# Patient Record
Sex: Female | Born: 1980 | Race: Black or African American | Hispanic: No | Marital: Single | Smoking: Current some day smoker
Health system: Southern US, Community
[De-identification: ages and names within clinical notes are randomized; demographics above are authoritative.]

## PROBLEM LIST (undated history)

## (undated) DIAGNOSIS — I10 Essential (primary) hypertension: Secondary | ICD-10-CM

---

## 1998-01-09 ENCOUNTER — Other Ambulatory Visit: Admission: RE | Admit: 1998-01-09 | Discharge: 1998-01-09 | Payer: Self-pay | Admitting: Obstetrics and Gynecology

## 1998-08-07 ENCOUNTER — Inpatient Hospital Stay (HOSPITAL_COMMUNITY): Admission: AD | Admit: 1998-08-07 | Discharge: 1998-08-07 | Payer: Self-pay | Admitting: Obstetrics and Gynecology

## 1998-08-15 ENCOUNTER — Inpatient Hospital Stay (HOSPITAL_COMMUNITY): Admission: AD | Admit: 1998-08-15 | Discharge: 1998-08-18 | Payer: Self-pay | Admitting: Obstetrics and Gynecology

## 1998-08-18 ENCOUNTER — Encounter (HOSPITAL_COMMUNITY): Admission: RE | Admit: 1998-08-18 | Discharge: 1998-11-16 | Payer: Self-pay | Admitting: *Deleted

## 1998-11-18 ENCOUNTER — Encounter (HOSPITAL_COMMUNITY): Admission: RE | Admit: 1998-11-18 | Discharge: 1999-02-16 | Payer: Self-pay | Admitting: *Deleted

## 1998-12-14 ENCOUNTER — Other Ambulatory Visit: Admission: RE | Admit: 1998-12-14 | Discharge: 1998-12-14 | Payer: Self-pay | Admitting: Obstetrics and Gynecology

## 1999-02-18 ENCOUNTER — Encounter (HOSPITAL_COMMUNITY): Admission: RE | Admit: 1999-02-18 | Discharge: 1999-05-19 | Payer: Self-pay | Admitting: Obstetrics and Gynecology

## 1999-05-22 ENCOUNTER — Encounter (HOSPITAL_COMMUNITY): Admission: RE | Admit: 1999-05-22 | Discharge: 1999-08-20 | Payer: Self-pay | Admitting: Obstetrics and Gynecology

## 1999-08-21 ENCOUNTER — Encounter (HOSPITAL_COMMUNITY): Admission: RE | Admit: 1999-08-21 | Discharge: 1999-11-19 | Payer: Self-pay | Admitting: Obstetrics and Gynecology

## 1999-12-20 ENCOUNTER — Encounter: Admission: RE | Admit: 1999-12-20 | Discharge: 2000-03-19 | Payer: Self-pay | Admitting: Obstetrics and Gynecology

## 2000-01-17 ENCOUNTER — Emergency Department (HOSPITAL_COMMUNITY): Admission: EM | Admit: 2000-01-17 | Discharge: 2000-01-17 | Payer: Self-pay | Admitting: Emergency Medicine

## 2000-01-18 ENCOUNTER — Encounter: Payer: Self-pay | Admitting: Emergency Medicine

## 2000-03-20 ENCOUNTER — Encounter: Admission: RE | Admit: 2000-03-20 | Discharge: 2000-06-18 | Payer: Self-pay | Admitting: Obstetrics and Gynecology

## 2000-06-05 ENCOUNTER — Other Ambulatory Visit: Admission: RE | Admit: 2000-06-05 | Discharge: 2000-06-05 | Payer: Self-pay | Admitting: *Deleted

## 2000-06-20 ENCOUNTER — Encounter: Admission: RE | Admit: 2000-06-20 | Discharge: 2000-09-18 | Payer: Self-pay | Admitting: Obstetrics and Gynecology

## 2001-01-01 ENCOUNTER — Other Ambulatory Visit: Admission: RE | Admit: 2001-01-01 | Discharge: 2001-01-01 | Payer: Self-pay | Admitting: *Deleted

## 2001-03-03 ENCOUNTER — Other Ambulatory Visit: Admission: RE | Admit: 2001-03-03 | Discharge: 2001-03-03 | Payer: Self-pay | Admitting: *Deleted

## 2001-10-27 ENCOUNTER — Other Ambulatory Visit: Admission: RE | Admit: 2001-10-27 | Discharge: 2001-10-27 | Payer: Self-pay | Admitting: Obstetrics and Gynecology

## 2002-06-13 ENCOUNTER — Emergency Department (HOSPITAL_COMMUNITY): Admission: EM | Admit: 2002-06-13 | Discharge: 2002-06-13 | Payer: Self-pay | Admitting: Emergency Medicine

## 2002-08-17 ENCOUNTER — Emergency Department (HOSPITAL_COMMUNITY): Admission: EM | Admit: 2002-08-17 | Discharge: 2002-08-18 | Payer: Self-pay | Admitting: Emergency Medicine

## 2002-11-09 ENCOUNTER — Observation Stay (HOSPITAL_COMMUNITY): Admission: AD | Admit: 2002-11-09 | Discharge: 2002-11-10 | Payer: Self-pay | Admitting: Obstetrics & Gynecology

## 2002-11-18 ENCOUNTER — Observation Stay (HOSPITAL_COMMUNITY): Admission: AD | Admit: 2002-11-18 | Discharge: 2002-11-19 | Payer: Self-pay | Admitting: Obstetrics & Gynecology

## 2002-11-22 ENCOUNTER — Ambulatory Visit (HOSPITAL_COMMUNITY): Admission: RE | Admit: 2002-11-22 | Discharge: 2002-11-22 | Payer: Self-pay | Admitting: Obstetrics and Gynecology

## 2002-11-22 ENCOUNTER — Encounter: Payer: Self-pay | Admitting: Obstetrics and Gynecology

## 2002-11-29 ENCOUNTER — Inpatient Hospital Stay (HOSPITAL_COMMUNITY): Admission: AD | Admit: 2002-11-29 | Discharge: 2002-11-30 | Payer: Self-pay | Admitting: Obstetrics & Gynecology

## 2002-11-30 ENCOUNTER — Encounter: Payer: Self-pay | Admitting: Obstetrics & Gynecology

## 2003-01-04 ENCOUNTER — Observation Stay (HOSPITAL_COMMUNITY): Admission: AD | Admit: 2003-01-04 | Discharge: 2003-01-05 | Payer: Self-pay | Admitting: Obstetrics & Gynecology

## 2003-01-27 ENCOUNTER — Inpatient Hospital Stay (HOSPITAL_COMMUNITY): Admission: AD | Admit: 2003-01-27 | Discharge: 2003-01-27 | Payer: Self-pay | Admitting: Obstetrics and Gynecology

## 2003-02-10 ENCOUNTER — Inpatient Hospital Stay (HOSPITAL_COMMUNITY): Admission: AD | Admit: 2003-02-10 | Discharge: 2003-02-10 | Payer: Self-pay | Admitting: Obstetrics & Gynecology

## 2003-02-18 ENCOUNTER — Inpatient Hospital Stay (HOSPITAL_COMMUNITY): Admission: AD | Admit: 2003-02-18 | Discharge: 2003-02-18 | Payer: Self-pay | Admitting: Obstetrics and Gynecology

## 2003-02-20 ENCOUNTER — Inpatient Hospital Stay (HOSPITAL_COMMUNITY): Admission: AD | Admit: 2003-02-20 | Discharge: 2003-02-20 | Payer: Self-pay | Admitting: Obstetrics and Gynecology

## 2003-03-10 ENCOUNTER — Inpatient Hospital Stay (HOSPITAL_COMMUNITY): Admission: AD | Admit: 2003-03-10 | Discharge: 2003-03-10 | Payer: Self-pay | Admitting: Obstetrics and Gynecology

## 2003-04-20 ENCOUNTER — Observation Stay (HOSPITAL_COMMUNITY): Admission: AD | Admit: 2003-04-20 | Discharge: 2003-04-20 | Payer: Self-pay | Admitting: Obstetrics and Gynecology

## 2003-04-24 ENCOUNTER — Inpatient Hospital Stay (HOSPITAL_COMMUNITY): Admission: AD | Admit: 2003-04-24 | Discharge: 2003-04-24 | Payer: Self-pay | Admitting: Obstetrics and Gynecology

## 2003-05-04 ENCOUNTER — Encounter (INDEPENDENT_AMBULATORY_CARE_PROVIDER_SITE_OTHER): Payer: Self-pay

## 2003-05-04 ENCOUNTER — Inpatient Hospital Stay (HOSPITAL_COMMUNITY): Admission: AD | Admit: 2003-05-04 | Discharge: 2003-05-07 | Payer: Self-pay | Admitting: Obstetrics and Gynecology

## 2003-05-08 ENCOUNTER — Encounter: Admission: RE | Admit: 2003-05-08 | Discharge: 2003-06-07 | Payer: Self-pay | Admitting: Obstetrics and Gynecology

## 2003-06-22 ENCOUNTER — Other Ambulatory Visit: Admission: RE | Admit: 2003-06-22 | Discharge: 2003-06-22 | Payer: Self-pay | Admitting: Obstetrics and Gynecology

## 2003-06-22 ENCOUNTER — Other Ambulatory Visit: Admission: RE | Admit: 2003-06-22 | Discharge: 2003-06-22 | Payer: Self-pay | Admitting: Internal Medicine

## 2003-07-08 ENCOUNTER — Encounter: Admission: RE | Admit: 2003-07-08 | Discharge: 2003-08-07 | Payer: Self-pay | Admitting: Obstetrics and Gynecology

## 2003-09-07 ENCOUNTER — Encounter: Admission: RE | Admit: 2003-09-07 | Discharge: 2003-10-07 | Payer: Self-pay | Admitting: Obstetrics and Gynecology

## 2003-10-08 ENCOUNTER — Encounter: Admission: RE | Admit: 2003-10-08 | Discharge: 2003-11-07 | Payer: Self-pay | Admitting: Obstetrics and Gynecology

## 2003-10-12 ENCOUNTER — Inpatient Hospital Stay (HOSPITAL_COMMUNITY): Admission: AD | Admit: 2003-10-12 | Discharge: 2003-10-15 | Payer: Self-pay | Admitting: Obstetrics and Gynecology

## 2003-10-23 ENCOUNTER — Inpatient Hospital Stay (HOSPITAL_COMMUNITY): Admission: AD | Admit: 2003-10-23 | Discharge: 2003-10-24 | Payer: Self-pay | Admitting: Obstetrics and Gynecology

## 2003-11-01 ENCOUNTER — Inpatient Hospital Stay (HOSPITAL_COMMUNITY): Admission: AD | Admit: 2003-11-01 | Discharge: 2003-11-02 | Payer: Self-pay | Admitting: Obstetrics and Gynecology

## 2003-12-06 ENCOUNTER — Encounter: Admission: RE | Admit: 2003-12-06 | Discharge: 2004-01-05 | Payer: Self-pay | Admitting: Obstetrics and Gynecology

## 2003-12-10 ENCOUNTER — Inpatient Hospital Stay (HOSPITAL_COMMUNITY): Admission: AD | Admit: 2003-12-10 | Discharge: 2003-12-10 | Payer: Self-pay | Admitting: Obstetrics and Gynecology

## 2003-12-13 ENCOUNTER — Emergency Department (HOSPITAL_COMMUNITY): Admission: EM | Admit: 2003-12-13 | Discharge: 2003-12-13 | Payer: Self-pay | Admitting: Emergency Medicine

## 2004-02-18 ENCOUNTER — Inpatient Hospital Stay (HOSPITAL_COMMUNITY): Admission: AD | Admit: 2004-02-18 | Discharge: 2004-02-21 | Payer: Self-pay | Admitting: Obstetrics and Gynecology

## 2004-02-27 ENCOUNTER — Ambulatory Visit (HOSPITAL_COMMUNITY): Admission: RE | Admit: 2004-02-27 | Discharge: 2004-02-27 | Payer: Self-pay | Admitting: Obstetrics and Gynecology

## 2004-03-19 ENCOUNTER — Encounter (INDEPENDENT_AMBULATORY_CARE_PROVIDER_SITE_OTHER): Payer: Self-pay | Admitting: Specialist

## 2004-03-19 ENCOUNTER — Inpatient Hospital Stay (HOSPITAL_COMMUNITY): Admission: AD | Admit: 2004-03-19 | Discharge: 2004-03-23 | Payer: Self-pay | Admitting: Obstetrics and Gynecology

## 2004-04-16 ENCOUNTER — Emergency Department (HOSPITAL_COMMUNITY): Admission: EM | Admit: 2004-04-16 | Discharge: 2004-04-16 | Payer: Self-pay | Admitting: Emergency Medicine

## 2004-04-18 ENCOUNTER — Emergency Department (HOSPITAL_COMMUNITY): Admission: EM | Admit: 2004-04-18 | Discharge: 2004-04-18 | Payer: Self-pay | Admitting: Emergency Medicine

## 2005-09-18 ENCOUNTER — Other Ambulatory Visit: Admission: RE | Admit: 2005-09-18 | Discharge: 2005-09-18 | Payer: Self-pay | Admitting: Obstetrics and Gynecology

## 2007-03-24 ENCOUNTER — Emergency Department (HOSPITAL_COMMUNITY): Admission: EM | Admit: 2007-03-24 | Discharge: 2007-03-24 | Payer: Self-pay | Admitting: Emergency Medicine

## 2010-07-09 ENCOUNTER — Emergency Department (HOSPITAL_COMMUNITY): Admission: EM | Admit: 2010-07-09 | Discharge: 2010-07-09 | Payer: Self-pay | Admitting: Emergency Medicine

## 2010-10-29 ENCOUNTER — Other Ambulatory Visit: Payer: Self-pay | Admitting: Obstetrics and Gynecology

## 2011-01-17 NOTE — Op Note (Signed)
NAME:  [name] [name], [name] [name]                          ACCOUNT NO.:  [id number]   MEDICAL RECORD NO.:  [id number]                   PATIENT TYPE:  INP   LOCATION:  9142                                 FACILITY:  [facility]   PHYSICIAN:  Miguel Aschoff, M.D.                    DATE OF BIRTH:  25-May-1981   DATE OF PROCEDURE:  03/19/2004  DATE OF DISCHARGE:                                 OPERATIVE REPORT   PREOPERATIVE DIAGNOSES:  Intrauterine twin gestation at 11 weeks in active  labor.   POSTOPERATIVE DIAGNOSES:  Intrauterine twin gestation at 34 weeks in active  labor with delivery of viable female infants.  Apgar 8 & 9 for baby A, Apgar  of 6 & 7 for baby B presenting as vertex breech presentation.   PROCEDURE:  Repeat low flap transverse cesarean section and bilateral  [place] tubal sterilization.   SURGEON:  Miguel Aschoff, M.D.   ANESTHESIA:  Spinal.   COMPLICATIONS:  None.   INDICATIONS FOR PROCEDURE:  The patient is a 30 year old black female,  gravida 4, para 2-0-1-2 at [redacted] weeks gestation.  The patient has an  intrauterine twin pregnancy previously treated for preterm labor with  Procardia. She presented to the [facility] on the date of admission  having regular uterine contractions, noted to be 5 cm dilated in active  labor. In view of the previous cesarean section and active labor status, at  this point she is being taken for immediate cesarean section.  In addition,  the patient has expressed a desire for sterilization procedure and signed  informed consent for tubal sterilization.   DESCRIPTION OF PROCEDURE:  The patient was taken to the operating room,  placed in a sitting position and spinal anesthesia was administered without  difficulty.  She was then placed in supine position, deviated to the left  and prepped and draped in the usual sterile fashion. A previous Pfannenstiel  incision was then reincised, the incision was extended down through the  subcutaneous tissue with  bleeding points being clamped and coagulated as  they were encountered. The fascia was then identified and incised  transversely.  It was then separated from the underlying rectus muscles.  The rectus muscles were then divided in the midline, peritoneum was then  found and entered careful avoiding underlying structures. The peritoneal  incision was extended, the bladder flap was then created and protected with  a bladder blade. At this point, an elliptical transverse incision was made  in the lower uterine segment, the amniotic cavity was entered, clear fluid  was obtained and at this point the patient was delivered of a viable female  infant presenting as vertex with an Apgar score of 8 at 1 minute and 9 at 5  minutes. The baby was delivered and handed to the pediatric team in  attendance. Once this was done, membranes were ruptured on baby B.  Baby B  was presenting as a double-footling breech. The baby was delivered without  difficulty from a left sacroanterior position.  The baby's Apgar score was 6  at 1 minute and 7 at 5 minutes.  At this point, cord bloods were obtained  for appropriate studies, the placenta was then delivered. At this point, the  uterine cavity was evacuated of any remaining products of conception.  Then  the __________ incision were identified, ligated using figure-of-eight  sutures of #1 Vicryl, then the uterus was closed in layers. The first layer  was running interlocking suture of #1 Vicryl, followed by an imbricating  suture of #1 Vicryl. The bladder flap was then reapproximated using running  continuous 2-0 Vicryl suture.  At this point, attention was directed to the  right fallopian tube, there was some peritubal filmy adhesions present.  These were lysed without difficulty and a knuckle of tube was created and  this knuckle of tube was created and this knuckle of tube was ligated with  two ligatures of #0 plain gut.  The area of tube above the ligatures  was  then excised and the tube was clamped and cauterized.  The identical  procedure was carried out on the left side again with some peritubal  adhesions noted.  After this was done, lap counts were taken and found to be  correct. The abdomen was irrigated with warm saline and then the abdomen was  closed. The parietoperitoneum was closed using running #0 Vicryl suture. The  rectus muscles were reapproximated using running continuous #0 Vicryl  suture.  The fascia was closed using two sutures of #0 Vicryl each starting  in the lateral fascial angles and meeting in the midline.  The patient had  both a keloid and a previous cesarean section scar.  This was then excised.  Interrupted #0 Vicryl suture was then placed in the subcutaneous tissue and  then skin incision was closed using staples. The estimated blood loss was  approximately 150 mL.  The patient tolerated the procedure well and went to  the recovery room in satisfactory condition.                                               Miguel Aschoff, M.D.    AR/MEDQ  D:  03/19/2004  T:  03/20/2004  Job:  [id number]

## 2011-01-17 NOTE — Discharge Summary (Signed)
NAME:  [name] [name], [name] [name]                          ACCOUNT NO.:  [id number]   MEDICAL RECORD NO.:  [id number]                   PATIENT TYPE:  INP   LOCATION:  9142                                 FACILITY:  [facility]   PHYSICIAN:  Gerrit Friends. Aldona Bar, M.D.                DATE OF BIRTH:  01/22/1981   DATE OF ADMISSION:  03/19/2004  DATE OF DISCHARGE:                                 DISCHARGE SUMMARY   DISCHARGE DIAGNOSES:  1. Thirty-three-week intrauterine pregnancy, twins, delivered by cesarean.     Twin A vertex, female, weight 4 pounds 4 ounces, Apgars 7 and 8.  Twin B     breech, weight 4 pounds 6.5 ounces, Apgars 6 and 7, female.  2. Preterm labor.  3. Desire for permanent elective sterilization.  4. Previous cesarean section.   SUMMARY:  This patient, a 30 year old female with twins, a gravida 4 para 2,  had problems with preterm labor during this pregnancy and was admitted  earlier with preterm labor and treated with magnesium sulfate and during  that admission received betamethasone.  She presented on March 19, 2004 in  active labor and was taken to the operating room by Dr. Tenny Craw at which time  she underwent a repeat low transverse cesarean section with delivery of twin  females under spinal anesthetic; at that time also had a tubal sterilization  procedure.  Other than chronic anemia and musculoskeletal discomfort, the  patient had a fairly benign postoperative course.  She was actually seen by  anesthesia on July 22 and essentially cleared for the potential of spinal  anesthetic complications.  The most recent hemoglobin was 7.7 on July 22  with a white count of 8900 and a platelet count at that time of 438,000.  She also had a comprehensive metabolic profile on July 22 and other than a  mildly elevated alkaline phosphatase of 148, the comprehensive metabolic  profile was fairly normal with the exception of a low total protein and low  albumin.   On the morning of July 23 she was  feeling better, ambulating well,  tolerating a regular diet fairly well, was comfortable with her analgesia.  The babies were in the intensive care unit and both were doing well,  destined for discharge probably the next 5-7 days.  She was ready for  discharge on the morning of July 23.  Her staples were removed and her wound  Steri-Stripped.  Her wound was clean and dry at the time of discharge.  She  was given all appropriate instructions at the time of discharge and  understood all instructions well.  Discharge medications include Dilaudid 2  mg one q.3-4h. as needed for pain #60 no refills, Motrin 600 mg q.6h. as  needed for less severe pain, and she requested Lexapro for postpartum  depression (previous problem with previous pregnancies as well) and was  given Lexapro 10 mg to take  every day.  She will return to the office for  follow-up in approximately 4 weeks' time or as needed.   CONDITION ON DISCHARGE:  Improved.                                               Gerrit Friends. Aldona Bar, M.D.    RMW/MEDQ  D:  03/23/2004  T:  03/23/2004  Job:  [id number]

## 2011-01-17 NOTE — Discharge Summary (Signed)
NAME:  [name], [name]                          ACCOUNT NO.:  [id number]   MEDICAL RECORD NO.:  [id number]                   PATIENT TYPE:  INP   LOCATION:  9158                                 FACILITY:  [facility]   PHYSICIAN:  Carrington Clamp, M.D.              DATE OF BIRTH:  1981-03-13   DATE OF ADMISSION:  02/18/2004  DATE OF DISCHARGE:  02/21/2004                                 DISCHARGE SUMMARY   ADMISSION DIAGNOSIS:  Preterm labor at 29 weeks with twins.   DISCHARGE DIAGNOSIS:  Preterm labor at 29 weeks with twins.   PERTINENT PROCEDURES PERFORMED:  1. Magnesium sulfate infusion.  2. Ultrasound, which revealed twin gestation, concordant growth, and normal     amniotic fluid.  Growth was consistent with 29 weeks.  3. Betamethasone administration on June 19 and June 20.   Pertinent laboratory tests were liver function tests, which were mildly  elevated, but on June 20 were AST 25, ALT 37, and total bilirubin 3.7.  Hemoglobin and hematocrit 8.1 and 25.3.   HISTORY AND PHYSICAL:  Please refer to the written history and physical on  chart.  Briefly, this is a 30 year old G4, P1-1-1-2, at 29-4/7 weeks,  complaining of pain and headache as well as some contractions.  The patient  was checked and found to be 1, 50, and -2.  Blood pressure was stable at  that point.  FHTs were reactive for A and B and tocometer was not recorded.   Assessment at that point was:  1. Twin pregnancy at 29+ weeks, preterm labor.  The patient was started on     magnesium sulfate and administered betamethasone.  2. Liver function tests were mildly elevated and repeated later.  3. Slight hypokalemia on admission was also corrected.  4. Anemia as well.   By hospital day #2, the patient was stable with a potassium of 3.7.  Liver  function tests within normal, good short-term and long-term reactivity for  both fetuses, and 2+ reflexes.  Magnesium was turned off that afternoon but  later on that evening, she  began contracting again and was restarted.  On  hospital day #3, the patient had done well overnight.  Cervical exam at that  point was fingertip, 1.5, and high.  The patient was treated for a yeast  infection.  The patient had no contractions over night, and therefore  magnesium was stopped again and Procardia 10 mg q.i.d. was started.  Hemoglobin drifted down to 8.1 but because of the patient's history of  having had an abruption at the time of her IV iron administration the last  pregnancy, I would prefer to keep her on p.o. iron until it is necessary to  do otherwise.  The patient has been tolerating a regular diet despite some  severe problems with hyperemesis in the past and will restart her liquid  iron daily and recheck her hemoglobin in two weeks  to make sure it is  stable.  By hospital day #4 the patient had tolerated being off the  magnesium overnight and on the Procardia without complication.  Fetal heart  tones were reactive for both A and B, and there was occasional contraction  noted.  Cervical exam was deferred, and the patient was discharged with the  following:   Medications are Procardia 10 mg one p.o. q.6h. continuously until return.   Diet was regular.  Activity was shower, bath, and eat only.  The patient was  counseled not to have any sexual activity.  Follow-up was with [facility] with Dr. Henderson Cloud on Monday.                                               Carrington Clamp, M.D.    MH/MEDQ  D:  02/21/2004  T:  02/23/2004  Job:  [id number]

## 2011-01-17 NOTE — Discharge Summary (Signed)
NAME:  [name] [name], SCHIMPF                          ACCOUNT NO.:  [id number]   MEDICAL RECORD NO.:  [id number]                   PATIENT TYPE:  INP   LOCATION:  9158                                 FACILITY:  [facility]   PHYSICIAN:  Carrington Clamp, M.D.              DATE OF BIRTH:  [birth year]   DATE OF ADMISSION:  02/18/2004  DATE OF DISCHARGE:  02/21/2004                                 DISCHARGE SUMMARY   FINAL DIAGNOSES:  1. Twin gestation at 29+ weeks.  2. Preterm labor.  3. Anemia.   HOSPITAL COURSE:  This 30 year old G4 P3 presents at 78 and four-sevenths  weeks gestation to maternity admissions with a headache and contractions.  The patient's antepartum course had been complicated by a history of anemia  this pregnancy which she was on iron for, and hyperemesis, a history of a  previous cesarean section, and a twin gestation.  The patient was started on  magnesium sulfate for the contractions and Stadol for her headache, was  given IV fluids for some probable dehydration.  The headaches improved.  The  patient's blood pressures were within normal limits.  She did not have any  visual changes.  Her lab work was all within normal limits except for a  hemoglobin of 8.9 and some slight hypocalcemia.  The patient's cervix was  about 1 cm dilated, 50% effaced, and -2 station which was no change from the  initial maternity admissions cervical exam.  At this point the patient was  admitted for continued monitoring and magnesium sulfate.  The patient had an  ultrasound that showed good growth of twin A and B with normal fluid volume  and both babies in the vertex position.  The patient was having some  occasional vomiting and cramping but less than upon admission.  The patient  was felt stable on hospital day #2, was weaned off of her magnesium sulfate  and started on oral terbutaline.  By hospital day #3 contractions began  again despite the terbutaline and magnesium sulfate was restarted.   The  babies were reactive during this point with good fetal heart tones.  The  magnesium sulfate was able to be stopped again on hospital day #3.  She had  received her two doses of betamethasone.  She was started on oral Procardia.  She was given liquid iron daily, continued to be monitored with hemoglobins  and hematocrits.  The patient also had a yeast infection which was treated  with antifungal cream.  By hospital day #4 the patient was doing much  better, only having occasional contractions, and she was felt ready for  discharge.  She was stable on her Procardia.  She was stable on her  Procardia 10 mg q.6h., was told to decrease activities, continue her  bedrest, of course was to call with any increase in her contractions or  problems.  She was to follow up in  our office on Monday with Dr. Henderson Cloud.   LABORATORY DATA AT DISCHARGE:  The patient had a hemoglobin of 8.1; white  blood cell count of 10.0; platelets of 336,000.  Calcium of 6.4.  Normal  liver function tests.     Leilani Able, P.A.-C.                Carrington Clamp, M.D.    MB/MEDQ  D:  02/26/2004  T:  02/26/2004  Job:  [id number]

## 2011-01-17 NOTE — Discharge Summary (Signed)
   NAME:  [name] [name], [name] [name] NO.:  [id number]   MEDICAL RECORD NO.:  [id number]                   PATIENT TYPE:   LOCATION:                                       FACILITY:   PHYSICIAN:  Miguel Aschoff, M.D.                    DATE OF BIRTH:   DATE OF ADMISSION:  11/29/2002  DATE OF DISCHARGE:  11/30/2002                                 DISCHARGE SUMMARY   ADMISSION DIAGNOSES:  1. Intrauterine pregnancy.  2. Hyperemesis gravidarum.   FINAL DIAGNOSES:  1. Intrauterine pregnancy.  2. Hyperemesis gravidarum.   BRIEF HISTORY:  The patient is a 30 year old black female in early pregnancy  who has been admitted for persistent nausea and vomiting.  Patient has lost  24 pounds since becoming pregnant due to the persistent nausea and vomiting.  She was admitted for IV hydration and assessment of the etiology of her  hyperemesis.   LABORATORY DATA:  On admission, her hemoglobin was 11.1, white count 8700.  Initial laboratory studies showed a sodium of 132, potassium of 3.0,  chloride of 93.  There were minor elevations of her AST at 54 and her ALT at  67.  Amylase was also elevated at 160.  Ultrasound was carried out and  revealed a 13-week intrauterine gestation with positive fetal heart  activity.  Gallbladder ultrasound was carried out; there was no evidence of  biliary dilatation.  No evidence of gallstones or gallbladder thickening was  noted.  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   HOSPITAL COURSE:  Patient continued to receive intravenous fluid for  correction of the nausea and vomiting and electrolyte abnormalities.  The  patient also had a surgical consultation to further assess the patient's  gallbladder; this was done by Dr. Consuello Bossier.  Dr. Zachery Dakins did not  feel at the current time that the patient needed gallbladder surgery.  By  November 30, 2002, she was stable.  Consultation was made to have a Zofran pump  administered to  the patient, and this was accomplished on November 30, 2002,  and the patient was discharged home.   DISCHARGE INSTRUCTIONS:  She was instructed to return to the office in one  week, to call for any problems with persistent nausea and vomiting.                                               Miguel Aschoff, M.D.    AR/MEDQ  D:  03/01/2003  T:  03/01/2003  Job:  [id number]

## 2011-01-17 NOTE — Discharge Summary (Signed)
NAME:  [name] [name], [name]                          ACCOUNT NO.:  [id number]   MEDICAL RECORD NO.:  [id number]                   PATIENT TYPE:  INP   LOCATION:  9319                                 FACILITY:  [facility]   PHYSICIAN:  Gerrit Friends. Aldona Bar, M.D.                DATE OF BIRTH:  Jan 27, 1981   DATE OF ADMISSION:  10/12/2003  DATE OF DISCHARGE:  10/15/2003                                 DISCHARGE SUMMARY   DISCHARGE DIAGNOSES:  1. Twin pregnancy, 11 to 12-week gestation.  2. Severe nausea and vomiting.  3. Elevated liver function tests and amylase secondary to #2.   SUMMARY:  A 30 year old gravida 4, para 2, with known twins, was admitted  from the office on February 10 with severe nausea and vomiting.  She was  begun on intravenous fluids as well as Zofran and Phenergan.  Over the  course of the subsequent three days, improved.  However, her liver function  tests were noted to be slightly abnormal.  Her SGOT on February 10 was 41,  on the day of discharge 32.  Her SGPT was 77 on the day of admission and 67  on the day of discharge.  In addition, an amylase was checked on February 11  and was found to be 159; on February 12, it was 167;  on February 13, the  day of discharge, it was 149.   The patient symptomatically improved dramatically over the 24 hours prior to  discharge, and when she was discharged, she was tolerating oral food well  and having minimal nausea and vomiting.  Her discharge hemoglobin was noted  to be 10.1 with a white count of 7700 and a platelet count of 337,000.  She  did have an ultrasound while she was in the hospital (this was done on  February 10), and it confirmed twins measuring consistent with 11 weeks and  three days.   On the afternoon of February 13, she was feeling dramatically better and was  discharged to home.   DISCHARGE MEDICATIONS:  1. Zofran 8 mg tablets 1 every 6 to 8 hours as needed for nausea and     vomiting.  2. Phenergan 25 mg tablet 1  every 4 to 6 hours as needed for nausea and     vomiting.  3. Unisom for vitamin B6.  4. Benadryl 25 mg at bedtime.   FOLLOW UP:  She will return to the office in four days' time, and at that  time we will have her liver function tests and amylase repeated.  If she  becomes symptomatically worse prior to Thursday, will return to [facility] for readmission.   CONDITION ON DISCHARGE:  Improved.  Gerrit Friends. Aldona Bar, M.D.    RMW/MEDQ  D:  10/15/2003  T:  10/15/2003  Job:  [id number]

## 2011-01-17 NOTE — Op Note (Signed)
NAME:  [name] [name], [name] [name]                          ACCOUNT NO.:  [id number]   MEDICAL RECORD NO.:  [id number]                   PATIENT TYPE:  INP   LOCATION:  9128                                 FACILITY:  [facility]   PHYSICIAN:  Carrington Clamp, M.D.              DATE OF BIRTH:  1980/12/22   DATE OF PROCEDURE:  05/04/2003  DATE OF DISCHARGE:                                 OPERATIVE REPORT   PREOPERATIVE DIAGNOSES:  1. Bradycardia at 35 weeks to 50s.  2. Suspected abruption.   POSTOPERATIVE DIAGNOSES:  1. Bradycardia at 35 weeks to 50s.  2. Suspected abruption.   PROCEDURE:  Primary low transverse cesarean section.   ATTENDING:  Carrington Clamp, M.D.   ASSISTANT:  Randye Lobo, M.D.   ANESTHESIA:  General endotracheal anesthesia.   ESTIMATED BLOOD LOSS:  500 mL.   FLUIDS REPLACED:  1000 mL.   URINE OUTPUT:  50 mL.   COMPLICATIONS:  None.   FINDINGS:  A female infant, vertex presentation, with Apgars 7 and 9.  Cord  pH was 7.1.  There were otherwise normal tubes, ovaries, and uterus seen.   MEDICATIONS:  Ancef and Pitocin.   PATHOLOGY:  Placenta.   COUNTS:  Correct x3.   REASON FOR OPERATION:  Ms. Mi Balla had been admitted to the MAU,  receiving her test dose of IV iron dextran secondary to anemia because of  her severe hyperemesis during her pregnancy.  The MAU nurse states that just  after she had received her test dose, the patient complained of severe chest  pain, back pain, and some swelling, and the fetal heart rate went down to  the 50s for several minutes.  The nurse stated that the uterus got very,  very hard and the fetal heart tones would not recover despite resuscitative  efforts.  I had been in another delivery but responded as soon as I could,  and the fetal heart tones were still in the 50s for several minutes.  I  discussed with the patient the need for immediate delivery by cesarean  section secondary to suspected abruption.   The patient  was rushed to the OR.  Fetal heart tones were rechecked again,  and they were back up to 115.  The patient was prepped and draped in the  usual fashion but then was intubated and a stat C-section then performed.   In addition to this, the patient had been tested for routine gonorrhea and  Chlamydia testing per state law at 35+ weeks.  Her Chlamydia had come back  positive and she was to be treated today while she was in the hospital.  Therefore, at the time of delivery she had not been treated for this and  pediatrics was alerted to the positive Chlamydia status.   TECHNIQUE:  The patient was prepped and draped in the usual sterile fashion  and general anesthesia was then performed.  A Pfannenstiel skin incision was  made with the scalpel and carried down to the fascia with the Bovie cautery.  The fascia was incised in the midline with the scalpel and then carried in a  transverse curvilinear manner with the Mayo scissors.  The fascia was  reflected superiorly and inferiorly from the rectus muscles and the rectus  muscles split in the midline.  A bowel-free portion of the peritoneum was  entered into bluntly and the peritoneum stretched to accommodate the  instruments.   The bladder blade was then placed and the vesicouterine fascia tented up and  incised in a transverse curvilinear manner.  Blunt dissection was then used  to bring down the bladder flap and the bladder blade was replaced.  A 2 cm  transverse incision was made in the upper portion of the lower uterine  segment until clear fluid was noted upon entry of the amnion.  The incision  was extended in a transverse curvilinear manner and the baby was identified  in a vertex presentation and delivered without complication.  The baby was  vigorous shortly after birth and did not come out floppy.  The baby was  handed immediately to awaiting pediatrics, who attended the baby.   Cord bloods and cord gases were obtained and the  placenta removed manually.  There was no clear indication of abruption; however, the placenta was then  sent to pathology for examination.  The uterus was then exteriorized,  wrapped in a wet lap, and cleared of all debris.  The uterine incision was  then closed with a running locked stitch of 0 Monocryl.  Two additional  figure-of-eight stitches were used to assure hemostasis.  The uterus was  then reapproximated in the abdomen and the abdomen cleared of all debris  with irrigation.  The uterine incision was reinspected and found to be  hemostatic.  The peritoneum was then closed with a running stitch of 2-0  Vicryl.  The fascia was closed with a running stitch of 0 Vicryl.  The  subcutaneous tissue was rendered hemostatic with Bovie cautery and  irrigation.  The skin was closed with staples.  The patient tolerated the  procedure well, was returned to the recovery room in stable condition.                                               Carrington Clamp, M.D.    MH/MEDQ  D:  05/04/2003  T:  05/04/2003  Job:  [id number]

## 2011-01-17 NOTE — Discharge Summary (Signed)
   NAME:  [name] [name], [name] [name]                          ACCOUNT NO.:  [id number]   MEDICAL RECORD NO.:  [id number]                   PATIENT TYPE:  INP   LOCATION:  9128                                 FACILITY:  [facility]   PHYSICIAN:  Malva Limes, M.D.                 DATE OF BIRTH:  04-18-1981   DATE OF ADMISSION:  05/04/2003  DATE OF DISCHARGE:  05/07/2003                                 DISCHARGE SUMMARY   PRINCIPAL DISCHARGE DIAGNOSES:  Fetal bradycardia.   PRINCIPAL PROCEDURE:  Primary low transverse cesarean section.   HISTORY OF PRESENT ILLNESS:  [name] is a 30 year old white female G2, P1-  0-0-1, last menstrual period August 27, 2002, [facility] June 03, 2003 who  presented to [facility] on May 04, 2003 for her second dose of IV  iron therapy secondary to severe anemia.  The patient had significant  hyperemesis and was unable to take any iron by mouth.  The patient had  undergone an IV transfusion approximately two weeks prior to this without  any complications.  The patient was undergoing test dose in maternity  admissions when she complained of sudden severe back pain and the fetal  heart tones went down into the 50s for several minutes.  The patient was  seen by Carrington Clamp, M.D. and it was felt that she possibly had a  placental abruption.  Therefore, she was taken to the operating room for an  emergent cesarean section.  This was performed by Carrington Clamp, M.D. and  Randye Lobo, M.D.  The patient delivered one live viable female infant.  Apgars were 7 at one minute and 9 at five minutes.  The cord pH was 7.1.  The patient had no evidence of an abruption.  The placenta was sent to  pathology and returned normal.  For a complete description of the operative  procedure, please see the dictated operative note.  The patient's  postoperative care was uneventful.  Her postoperative hemoglobin was 7.9.  The patient was ambulating without difficulty and eating a  regular diet.  She was discharged to home on postoperative day #3.  The incision appeared  to be healing well.  The patient was instructed to follow up in the office  in four weeks.  The patient was sent home with Tylox.                                               Malva Limes, M.D.    MA/MEDQ  D:  06/02/2003  T:  06/02/2003  Job:  [id number]

## 2013-12-15 ENCOUNTER — Other Ambulatory Visit: Payer: Self-pay | Admitting: Obstetrics and Gynecology

## 2014-03-08 ENCOUNTER — Emergency Department (HOSPITAL_COMMUNITY)
Admission: EM | Admit: 2014-03-08 | Discharge: 2014-03-09 | Disposition: A | Discharge status: Home / Self Care | Payer: BC Managed Care – PPO | Attending: Emergency Medicine | Admitting: Emergency Medicine

## 2014-03-08 ENCOUNTER — Encounter (HOSPITAL_COMMUNITY): Payer: Self-pay | Admitting: Emergency Medicine

## 2014-03-08 ENCOUNTER — Emergency Department (HOSPITAL_COMMUNITY): Payer: BC Managed Care – PPO

## 2014-03-08 DIAGNOSIS — F17200 Nicotine dependence, unspecified, uncomplicated: Secondary | ICD-10-CM | POA: Insufficient documentation

## 2014-03-08 DIAGNOSIS — Y9289 Other specified places as the place of occurrence of the external cause: Secondary | ICD-10-CM | POA: Insufficient documentation

## 2014-03-08 DIAGNOSIS — I10 Essential (primary) hypertension: Secondary | ICD-10-CM | POA: Insufficient documentation

## 2014-03-08 DIAGNOSIS — Y9389 Activity, other specified: Secondary | ICD-10-CM | POA: Insufficient documentation

## 2014-03-08 DIAGNOSIS — R079 Chest pain, unspecified: Secondary | ICD-10-CM | POA: Insufficient documentation

## 2014-03-08 DIAGNOSIS — S01501A Unspecified open wound of lip, initial encounter: Secondary | ICD-10-CM | POA: Insufficient documentation

## 2014-03-08 DIAGNOSIS — X58XXXA Exposure to other specified factors, initial encounter: Secondary | ICD-10-CM | POA: Insufficient documentation

## 2014-03-08 DIAGNOSIS — I951 Orthostatic hypotension: Secondary | ICD-10-CM

## 2014-03-08 DIAGNOSIS — R51 Headache: Secondary | ICD-10-CM | POA: Insufficient documentation

## 2014-03-08 HISTORY — DX: Essential (primary) hypertension: I10

## 2014-03-08 LAB — BASIC METABOLIC PANEL
ANION GAP: 14 (ref 5–15)
BUN: 20 mg/dL (ref 6–23)
CHLORIDE: 98 meq/L (ref 96–112)
CO2: 23 meq/L (ref 19–32)
CREATININE: 1.04 mg/dL (ref 0.50–1.10)
Calcium: 9.6 mg/dL (ref 8.4–10.5)
GFR calc Af Amer: 80 mL/min — ABNORMAL LOW (ref >90)
GFR, EST NON AFRICAN AMERICAN: 69 mL/min — AB (ref >90)
Glucose, Bld: 96 mg/dL (ref 70–99)
POTASSIUM: 4.1 meq/L (ref 3.7–5.3)
SODIUM: 135 meq/L — AB (ref 137–147)

## 2014-03-08 LAB — I-STAT TROPONIN, ED: TROPONIN I, POC: 0 ng/mL (ref 0.00–0.08)

## 2014-03-08 LAB — CBC WITH DIFFERENTIAL/PLATELET
BASOS ABS: 0 10*3/uL (ref 0.0–0.1)
BASOS PCT: 0 % (ref 0–1)
Eosinophils Absolute: 0.3 10*3/uL (ref 0.0–0.7)
Eosinophils Relative: 2 % (ref 0–5)
HCT: 32.6 % — ABNORMAL LOW (ref 36.0–46.0)
HEMOGLOBIN: 9.8 g/dL — AB (ref 12.0–15.0)
Lymphocytes Relative: 23 % (ref 12–46)
Lymphs Abs: 2.9 10*3/uL (ref 0.7–4.0)
MCH: 17.5 pg — ABNORMAL LOW (ref 26.0–34.0)
MCHC: 30.1 g/dL (ref 30.0–36.0)
MCV: 58.2 fL — AB (ref 78.0–100.0)
MONOS PCT: 7 % (ref 3–12)
Monocytes Absolute: 0.9 10*3/uL (ref 0.1–1.0)
Neutro Abs: 8.7 10*3/uL — ABNORMAL HIGH (ref 1.7–7.7)
Neutrophils Relative %: 68 % (ref 43–77)
PLATELETS: 430 10*3/uL — AB (ref 150–400)
RBC: 5.6 MIL/uL — ABNORMAL HIGH (ref 3.87–5.11)
RDW: 20.6 % — ABNORMAL HIGH (ref 11.5–15.5)
WBC: 12.8 10*3/uL — AB (ref 4.0–10.5)

## 2014-03-08 MED ORDER — MORPHINE SULFATE 4 MG/ML IJ SOLN
4.0000 mg | Freq: Once | INTRAMUSCULAR | Status: AC
  Administered 2014-03-08: 4 mg via INTRAVENOUS
  Filled 2014-03-08: qty 1

## 2014-03-08 MED ORDER — ASPIRIN 81 MG PO CHEW
324.0000 mg | CHEWABLE_TABLET | Freq: Once | ORAL | Status: AC
  Administered 2014-03-08: 324 mg via ORAL
  Filled 2014-03-08: qty 4

## 2014-03-08 NOTE — ED Notes (Addendum)
Pt A+Ox4, reports onset CP while at rest, reports "got up to stretch and go to the bathroom" and then reports waking up feeling confused on the floor.  Pt denies CP at this time, reports feeling generally unwell "heart racing, tired, dizzy, just feel like crap".  A+Ox4.  neuros grossly intact.  Speaking full/clear sentences, rr even/un-lab.  Skin PWD.  Sm. Lac noted to lower inner lip.  Bleeding controlled.  Changed to hospital gown.  Placed to cardiac/02 monitor.  NAD.

## 2014-03-08 NOTE — ED Provider Notes (Signed)
CSN: [id number]     Arrival date & time 03/08/14  2057 History   First MD Initiated Contact with Patient 03/08/14 2101     Chief Complaint  Patient presents with  . Chest Pain  . Loss of Consciousness     (Consider location/radiation/quality/duration/timing/severity/associated sxs/prior Treatment) HPI Comments: Patient presents to the emergency department with chief complaint of syncopal episode and chest pain. Patient states that she was sitting at home on the couch, when she began to have some central chest pain. She reports associated shortness of breath, but denies any diaphoresis. She states that she stubbed the bathroom, when she became dizzy, and passed out. When she woke, she remembers having a headache, and feeling pain in her lip. She states that she still has a headache, and feels slightly confused. She states currently her pain is 6/10. She endorses associated shortness of breath. She has a history of hypertension and smoking history, but denies any history of diabetes, hyperlipidemia, or family history of heart disease.  The history is provided by the patient. No language interpreter was used.    Past Medical History  Diagnosis Date  . Hypertension    Past Surgical History  Procedure Laterality Date  . Cesarean section     No family history on file. History  Substance Use Topics  . Smoking status: Current Every Day Smoker  . Smokeless tobacco: Never Used  . Alcohol Use: Yes     Comment: 3/week   OB History   Grav Para Term Preterm Abortions TAB SAB Ect Mult Living                 Review of Systems  All other systems reviewed and are negative.     Allergies  Review of patient's allergies indicates no known allergies.  Home Medications   Prior to Admission medications   Not on File   BP 176/116  Pulse 89  Resp 20  SpO2 100%  LMP 02/13/2014 Physical Exam  Nursing note and vitals reviewed. Constitutional: She is oriented to person, place, and time.  She appears well-developed and well-nourished.  HENT:  Head: Normocephalic and atraumatic.  Right Ear: External ear normal.  Left Ear: External ear normal.  Interior lower lip remarkable for a 1 cm laceration, dentition intact Moderate swelling of right-sided lower lip  Eyes: Conjunctivae and EOM are normal. Pupils are equal, round, and reactive to light.  Neck: Normal range of motion. Neck supple.  No pain with neck flexion, no meningismus  Cardiovascular: Normal rate, regular rhythm and normal heart sounds.  Exam reveals no gallop and no friction rub.   No murmur heard. Pulmonary/Chest: Effort normal and breath sounds normal. No respiratory distress. She has no wheezes. She has no rales. She exhibits no tenderness.  Abdominal: Soft. Bowel sounds are normal. She exhibits no distension and no mass. There is no tenderness. There is no rebound and no guarding.  Musculoskeletal: Normal range of motion. She exhibits no edema and no tenderness.  Normal gait.  Neurological: She is alert and oriented to person, place, and time. She has normal reflexes.  CN 3-12 intact, normal finger to nose, no pronator drift, sensation and strength intact bilaterally.  Skin: Skin is warm and dry.  Psychiatric: She has a normal mood and affect. Her behavior is normal. Judgment and thought content normal.    ED Course  Procedures (including critical care time) Results for orders placed during the hospital encounter of 03/08/14  CBC WITH DIFFERENTIAL  Result Value Ref Range   WBC 12.8 (*) 4.0 - 10.5 K/uL   RBC 5.60 (*) 3.87 - 5.11 MIL/uL   Hemoglobin 9.8 (*) 12.0 - 15.0 g/dL   HCT 16.1 (*) 09.6 - 04.5 %   MCV 58.2 (*) 78.0 - 100.0 fL   MCH 17.5 (*) 26.0 - 34.0 pg   MCHC 30.1  30.0 - 36.0 g/dL   RDW 40.9 (*) 81.1 - 91.4 %   Platelets 430 (*) 150 - 400 K/uL   Neutrophils Relative % 68  43 - 77 %   Lymphocytes Relative 23  12 - 46 %   Monocytes Relative 7  3 - 12 %   Eosinophils Relative 2  0 - 5 %    Basophils Relative 0  0 - 1 %   Neutro Abs 8.7 (*) 1.7 - 7.7 K/uL   Lymphs Abs 2.9  0.7 - 4.0 K/uL   Monocytes Absolute 0.9  0.1 - 1.0 K/uL   Eosinophils Absolute 0.3  0.0 - 0.7 K/uL   Basophils Absolute 0.0  0.0 - 0.1 K/uL   RBC Morphology POLYCHROMASIA PRESENT    BASIC METABOLIC PANEL      Result Value Ref Range   Sodium 135 (*) 137 - 147 mEq/L   Potassium 4.1  3.7 - 5.3 mEq/L   Chloride 98  96 - 112 mEq/L   CO2 23  19 - 32 mEq/L   Glucose, Bld 96  70 - 99 mg/dL   BUN 20  6 - 23 mg/dL   Creatinine, Ser 7.82  0.50 - 1.10 mg/dL   Calcium 9.6  8.4 - 95.6 mg/dL   GFR calc non Af Amer 69 (*) >90 mL/min   GFR calc Af Amer 80 (*) >90 mL/min   Anion gap 14  5 - 15  I-STAT TROPOININ, ED      Result Value Ref Range   Troponin i, poc 0.00  0.00 - 0.08 ng/mL   Comment 3            Ct Head Wo Contrast  03/08/2014   CLINICAL DATA:  Syncopal episode.  EXAM: CT HEAD WITHOUT CONTRAST  TECHNIQUE: Contiguous axial images were obtained from the base of the skull through the vertex without contrast.  COMPARISON:  None  FINDINGS: No evidence for acute hemorrhage, mass lesion, midline shift, hydrocephalus or large infarct. There is a large polyp or retention cyst in the right maxillary sinus. No acute bone abnormality.  IMPRESSION: No acute intracranial abnormality.  Right maxillary sinus polyp.   Electronically Signed   By: Richarda Overlie M.D.   On: 03/08/2014 22:02   Dg Chest Portable 1 View  03/08/2014   CLINICAL DATA:  Chest pain  EXAM: PORTABLE CHEST - 1 VIEW  COMPARISON:  None.  FINDINGS: Lungs are clear. The heart size and pulmonary vascularity are normal. No adenopathy. No pneumothorax. No bone lesions.  IMPRESSION: No abnormality noted.   Electronically Signed   By: Bretta Bang M.D.   On: 03/08/2014 21:49   LACERATION REPAIR Performed by: Roxy Horseman Authorized by: Roxy Horseman Consent: Verbal consent obtained. Risks and benefits: risks, benefits and alternatives were  discussed Consent given by: patient Patient identity confirmed: provided demographic data Prepped and Draped in normal sterile fashion Wound explored  Laceration Location: lower interior lip  Laceration Length: 1 cm  No Foreign Bodies seen or palpated  Anesthesia: local infiltration  Local anesthetic: lidocaine 2% without epinephrine  Anesthetic total: 2 ml  Irrigation  method: syringe Amount of cleaning: standard  Skin closure: 4-0 vicryl rapide  Number of sutures: 2  Technique: interrupted  Patient tolerance: Patient tolerated the procedure well with no immediate complications.   Imaging Review No results found.   EKG Interpretation   Date/Time:  Wednesday March 08 2014 23:58:11 EDT Ventricular Rate:  66 PR Interval:  172 QRS Duration: 102 QT Interval:  412 QTC Calculation: 432 R Axis:   -37 Text Interpretation:  Sinus rhythm Incomplete RBBB and LAFB Probable left  ventricular hypertrophy Mild inferior T wave depressions, new Confirmed by  Gwendolyn GrantWALDEN  MD, BLAIR ([id number]) on 03/09/2014 12:07:02 AM      MDM   Final diagnoses:  Chest pain, unspecified chest pain type  Syncope due to orthostatic hypotension    Patient with chest pain and syncopal episode. Suspect that the syncopal episode was likely vasovagal, as it occurred, immediately after standing. She states that she felt dizzy, and then passed out. Will check labs, chest x-ray, and EKG. Will also check head CT as the confusion and amnesia following loss of consciousness.   12:52 AM Labs are reassuring. Delta troponin and EKG unchanged. Laceration of the interior lip repaired.  HEART score is 1.  PERC negative. No history of PE or DVT, no recent surgery, no hemoptysis, no exogenous estrogen use, no unilateral leg swelling, no recent long travel. Low risk for serious outcome per [facility] [place] Syncope Rule.  No hx of CHF, hematocrit >30%, no new EKG changes or arrhythmias, no SOB, systolic BP >90 in triage.  DC to  home.    Roxy Horsemanobert Login Muckleroy, PA-C 03/09/14 [phone]

## 2014-03-09 LAB — I-STAT TROPONIN, ED: Troponin i, poc: 0 ng/mL (ref 0.00–0.08)

## 2014-03-09 NOTE — ED Provider Notes (Signed)
Medical screening examination/treatment/procedure(s) were performed by non-physician practitioner and as supervising physician I was immediately available for consultation/collaboration.   EKG Interpretation   Date/Time:  Wednesday March 08 2014 23:58:11 EDT Ventricular Rate:  66 PR Interval:  172 QRS Duration: 102 QT Interval:  412 QTC Calculation: 432 R Axis:   -37 Text Interpretation:  Sinus rhythm Incomplete RBBB and LAFB Probable left  ventricular hypertrophy Mild inferior T wave depressions, new Confirmed by  Gwendolyn GrantWALDEN  MD, Jalesa Thien ([id number]) on 03/09/2014 12:07:02 AM        [name] HaitWilliam Aquilla Voiles, MD 03/09/14 2354

## 2014-03-09 NOTE — Discharge Instructions (Signed)
Chest Pain (Nonspecific) It is often hard to give a diagnosis for the cause of chest pain. There is always a chance that your pain could be related to something serious, such as a heart attack or a blood clot in the lungs. You need to follow up with your doctor. HOME CARE  If antibiotic medicine was given, take it as directed by your doctor. Finish the medicine even if you start to feel better.  For the next few days, avoid activities that bring on chest pain. Continue physical activities as told by your doctor.  Do not use any tobacco products. This includes cigarettes, chewing tobacco, and e-cigarettes.  Avoid drinking alcohol.  Only take medicine as told by your doctor.  Follow your doctor's suggestions for more testing if your chest pain does not go away.  Keep all doctor visits you made. GET HELP IF:  Your chest pain does not go away, even after treatment.  You have a rash with blisters on your chest.  You have a fever. GET HELP RIGHT AWAY IF:   You have more pain or pain that spreads to your arm, neck, jaw, back, or belly (abdomen).  You have shortness of breath.  You cough more than usual or cough up blood.  You have very bad back or belly pain.  You feel sick to your stomach (nauseous) or throw up (vomit).  You have very bad weakness.  You pass out (faint).  You have chills. This is an emergency. Do not wait to see if the problems will go away. Call your local emergency services (911 in U.S.). Do not drive yourself to the hospital. MAKE SURE YOU:   Understand these instructions.  Will watch your condition.  Will get help right away if you are not doing well or get worse. Document Released: 02/04/2008 Document Revised: 08/23/2013 Document Reviewed: 02/04/2008 [facility] Patient Information 2015 [place], [place]. This information is not intended to replace advice given to you by your health care provider. Make sure you discuss any questions you have with your  health care provider. Syncope Syncope is a medical term for fainting or passing out. This means you lose consciousness and drop to the ground. People are generally unconscious for less than 5 minutes. You may have some muscle twitches for up to 15 seconds before waking up and returning to normal. Syncope occurs more often in older adults, but it can happen to anyone. While most causes of syncope are not dangerous, syncope can be a sign of a serious medical problem. It is important to seek medical care.  CAUSES  Syncope is caused by a sudden drop in blood flow to the brain. The specific cause is often not determined. Factors that can bring on syncope include:  Taking medicines that lower blood pressure.  Sudden changes in posture, such as standing up quickly.  Taking more medicine than prescribed.  Standing in one place for too long.  Seizure disorders.  Dehydration and excessive exposure to heat.  Low blood sugar (hypoglycemia).  Straining to have a bowel movement.  Heart disease, irregular heartbeat, or other circulatory problems.  Fear, emotional distress, seeing blood, or severe pain. SYMPTOMS  Right before fainting, you may:  Feel dizzy or light-headed.  Feel nauseous.  See all white or all black in your field of vision.  Have cold, clammy skin. DIAGNOSIS  Your health care provider will ask about your symptoms, perform a physical exam, and perform an electrocardiogram (ECG) to record the electrical activity of  your heart. Your health care provider may also perform other heart or blood tests to determine the cause of your syncope which may include:  Transthoracic echocardiogram (TTE). During echocardiography, sound waves are used to evaluate how blood flows through your heart.  Transesophageal echocardiogram (TEE).  Cardiac monitoring. This allows your health care provider to monitor your heart rate and rhythm in real time.  Holter monitor. This is a portable device  that records your heartbeat and can help diagnose heart arrhythmias. It allows your health care provider to track your heart activity for several days, if needed.  Stress tests by exercise or by giving medicine that makes the heart beat faster. TREATMENT  In most cases, no treatment is needed. Depending on the cause of your syncope, your health care provider may recommend changing or stopping some of your medicines. HOME CARE INSTRUCTIONS  Have someone stay with you until you feel stable.  Do not drive, use machinery, or play sports until your health care provider says it is okay.  Keep all follow-up appointments as directed by your health care provider.  Lie down right away if you start feeling like you might faint. Breathe deeply and steadily. Wait until all the symptoms have passed.  Drink enough fluids to keep your urine clear or pale yellow.  If you are taking blood pressure or heart medicine, get up slowly and take several minutes to sit and then stand. This can reduce dizziness. SEEK IMMEDIATE MEDICAL CARE IF:   You have a severe headache.  You have unusual pain in the chest, abdomen, or back.  You are bleeding from your mouth or rectum, or you have black or tarry stool.  You have an irregular or very fast heartbeat.  You have pain with breathing.  You have repeated fainting or seizure-like jerking during an episode.  You faint when sitting or lying down.  You have confusion.  You have trouble walking.  You have severe weakness.  You have vision problems. If you fainted, call your local emergency services (911 in U.S.). Do not drive yourself to the hospital.  MAKE SURE YOU:  Understand these instructions.  Will watch your condition.  Will get help right away if you are not doing well or get worse. Document Released: 08/18/2005 Document Revised: 08/23/2013 Document Reviewed: 10/17/2011 [facility] Patient Information 2015 [place], [place]. This information is not  intended to replace advice given to you by your health care provider. Make sure you discuss any questions you have with your health care provider.   Emergency Department Resource Guide 1) Find a Doctor and Pay Out of Pocket Although you won't have to find out who is covered by your insurance plan, it is a good idea to ask around and get recommendations. You will then need to call the office and see if the doctor you have chosen will accept you as a new patient and what types of options they offer for patients who are self-pay. Some doctors offer discounts or will set up payment plans for their patients who do not have insurance, but you will need to ask so you aren't surprised when you get to your appointment.  2) Contact Your Local Health Department Not all health departments have doctors that can see patients for sick visits, but many do, so it is worth a call to see if yours does. If you don't know where your local health department is, you can check in your phone book. The CDC also has a tool to help  you locate your state's health department, and many state websites also have listings of all of their local health departments.  3) Find a Walk-in Clinic If your illness is not likely to be very severe or complicated, you may want to try a walk in clinic. These are popping up all over the country in pharmacies, drugstores, and shopping centers. They're usually staffed by nurse practitioners or physician assistants that have been trained to treat common illnesses and complaints. They're usually fairly quick and inexpensive. However, if you have serious medical issues or chronic medical problems, these are probably not your best option.  No Primary Care Doctor: - Call Health Connect at  [phone] - they can help you locate a primary care doctor that  accepts your insurance, provides certain services, etc. - Physician Referral Service- [phone]  Chronic Pain Problems: Organization          Address  Phone   Notes  [facility]  [phone] Patients need to be referred by their primary care doctor.   Medication Assistance: Organization         Address  Phone   Notes  [facility] Medication [facility] [address] [place] [place], [place] [address] [phone] --Must be a resident of [facility] -- Must have NO insurance coverage whatsoever (no Medicaid/ Medicare, etc.) -- The pt. MUST have a primary care doctor that directs their care regularly and follows them in the community   MedAssist  [phone]   [employer]  [phone]    Agencies that provide inexpensive medical care: Organization         Address  Phone   Notes  [facility]  [phone]   [facility]    [phone]   [facility] [address] [place], [place] [address] [phone]   [facility] [place] [address] [place], [place] [phone]   Planned Parenthood    [phone]   [facility]    [phone]   [facility]  [address], [place] Phone:  [phone], Fax:  [phone] Hours of Operation:  9 am - 6 pm, M-F.  Also accepts Medicaid/Medicare and self-pay.  [facility]  [address], [place] Phone: [phone], Fax: [phone]. Hours of Operation:  8:30 am - 5:30 pm, M-F.  Also accepts Medicaid and self-pay.  [facility] [place] [address] [place] Phone: [phone]   [facility] [address] Natasha Bence [place], [place] [phone], Ext. [phone] Mondays & Thursdays: 7-9 AM.  First 15 patients are seen on a first come, first serve basis.    Medicaid-accepting [facility] Providers:  Organization         Address  Phone   Notes  [facility] [address], [place] [phone] Also accepts self-pay patients.  [facility] [address] Laurell Josephs [place], [place]  [phone]   [facility] [address], [place] [phone]   [facility] [address] [place] [phone]   Renaye Rakers [address], [place]   [phone] Only accepts [place] Access [place] patients after they have their name applied to their card.   Self-Pay (no insurance) in [facility]:  Organization  Address  Phone   Notes  Sickle Cell Patients, [facility] Internal Medicine [place] [phone]   [facility] [place] [phone]   [facility] [place]  [place], [place] [phone]   [facility]/Dr. Osei-Bonsu  [address], [place] or [address], [place] [phone] Phone number for both [place] and [place] locations is the same.  Urgent Medical and [facility] [address] [place] [phone]   [facility] [address] [place] or [address] [phone]   [facility] [address] [place] [phone], phone; [phone], fax Sees patients 1st and 3rd Saturday of every month.  Must not qualify for public or private insurance (i.e. Medicaid, Medicare, [place] Health Choice, Veterans' Benefits)  Household income should be no more than 200% of the poverty level The clinic cannot treat you if you are pregnant or think you are pregnant  Sexually transmitted diseases are not treated at the clinic.    Dental Care: Organization         Address  Phone  Notes  [facility] [place] [phone] Accepts children up to age 22 who are enrolled in [place] or [place]; pregnant women with a Medicaid card; and  children who have applied for Medicaid or [place] Health Choice, but were declined, whose parents can pay a reduced fee at time of service.  [facility] [address] [facility]  [address], [place] [phone] Accepts children up to age 15 who are enrolled in [place] or [place]; pregnant women with a Medicaid card; and children who have applied for Medicaid or [place] Health Choice, but were declined, whose parents can pay a reduced fee at time of service.  [facility] PRO[facility]  [place] [phone] Patients are seen by appointment only. Walk-ins are not accepted. [place] will see patients 72 years of age and older. Monday - Tuesday (8am-5pm) Most Wednesdays (8:30-5pm) $30 per visit, cash only  [facility] Adult Dental Access PROGRAM  [address], [facility] [phone] Patients are seen by appointment only. Walk-ins are not accepted. [place] will see patients 28 years of age and older. One Wednesday Evening (Monthly: Volunteer Based).  $30 per visit, cash only  [place]  [phone] for adults; Children under age 45, call Graduate Pediatric Dentistry at [phone]. Children aged 65-14, please call [phone] to request a pediatric application.  Dental services are provided in all areas of dental care including fillings, crowns and bridges, complete and partial dentures, implants, gum treatment, root canals, and extractions. Preventive care is also provided. Treatment is provided to both adults and children. Patients are selected via a lottery and there is often a waiting list.   [facility] [address] [place]  [phone] [email or web address]   [facility] [address] [place], [place] [phone], Ext. [phone] Second and Fourth Thursday of each month, opens at 6:30 AM; Clinic ends at 9 AM.  Patients are seen on a first-come first-served  basis, and a limited number are seen during each clinic.   [facility]  [address] [place], [place]  [place], [place] [phone]   Eligibility Requirements You must have lived in [place], [place], or [place] counties for at least the last three months.   You cannot be eligible for state or federal sponsored [employer], including [employer], [place], or [employer].   You generally cannot be eligible for healthcare insurance through your employer.    How to apply: Eligibility screenings are held every Tuesday and Wednesday afternoon from 1:00 pm until 4:00 pm. You do not need an appointment for the interview!  [facility] [address], [place], [place]   [place]  [place] Department  [place]  [phone]    Behavioral Health Resources in the Community: Intensive Outpatient Programs Organization         Address  Phone  Notes  [place]. [address], [place], [place] [phone]   [facility] [address], [place], [place]   ADS: [facility] Drug [facility] [address], [place], [place]   [place] [address],  [place], [place] or [phone]   Substance Abuse Resources Organization         Address  Phone  Notes  [facility] Drug Services  [phone]   [place]  [phone]   [facility] [place]   Chinita Pester  [phone]   Residential & Outpatient Substance Abuse Program  [phone]   Psychological Services Organization         Address  Phone  Notes  [facility]  [place]  [phone]   [place] [address], [place] or [phone]    Mobile Crisis Teams Organization          Address  Phone  Notes  [facility], Mobile Crisis Care Unit  [phone]   [facility]  [address] [place], [place]   [place] Levels [address] [place] [phone]    Self-Help/Support Groups Organization         Address  Phone             Notes  [place]. of [place] - variety of support groups  [place] Call for more information  Narcotics Anonymous (NA), [facility] [address] [place] [address], [employer] [place]  2 meetings at this location   Special educational needs teacher         Address  Phone  Notes  [facility]idential Treatment [place],    [place]  [phone]   [facility]  [address] [place] [phone], [place], [place]   [place], [place] [phone] Admissions: 8am-3pm M-F  Incentives Substance [place] [address]    [place], [place] [phone]   [facility] [address] Jadene Pierini [place], [place]   [facility] [address],  [place], [place]   Insight Programs - Intensive Outpatient [address] Kristeen [address], [place], [place]   [facility] ([place] [place].,  [place], [place] or [phone]   [facility] (RTS) [address], [place], [place] Accepts Medicaid  [facility] [address],  [place] [phone] Substance Abuse/Addiction Treatment   [facility] Resources Organization  Address  Phone  Notes  [facility]  [phone]   Domenic Schwab, PhD [address] Arlis Porta [place], [place]   [phone] or [phone]   [place], [place] [phone]   [address], [place], [place] [phone] Insurance/Medicaid/sponsorship  through [facility] and Families [address]                                    [place], [place] [phone] [place] [address], [place] [phone]    Dr. Adele Schilder  [phone]   [facility] Dept. 1) [address], [place] 2) [place] 3)  [place] [address], [place] [phone]  [phone]   [place] [phone] or [phone] (After Hours)

## 2014-11-27 ENCOUNTER — Ambulatory Visit (INDEPENDENT_AMBULATORY_CARE_PROVIDER_SITE_OTHER): Payer: No Typology Code available for payment source | Admitting: Physician Assistant

## 2014-11-27 VITALS — BP 132/84 | HR 80 | Temp 98.7°F | Resp 16 | Ht 65.0 in | Wt 189.4 lb

## 2014-11-27 DIAGNOSIS — K529 Noninfective gastroenteritis and colitis, unspecified: Secondary | ICD-10-CM

## 2014-11-27 DIAGNOSIS — R112 Nausea with vomiting, unspecified: Secondary | ICD-10-CM | POA: Diagnosis not present

## 2014-11-27 DIAGNOSIS — R350 Frequency of micturition: Secondary | ICD-10-CM | POA: Diagnosis not present

## 2014-11-27 DIAGNOSIS — I10 Essential (primary) hypertension: Secondary | ICD-10-CM

## 2014-11-27 LAB — POCT UA - MICROSCOPIC ONLY
Casts, Ur, LPF, POC: NEGATIVE
Crystals, Ur, HPF, POC: NEGATIVE
Mucus, UA: NEGATIVE
Yeast, UA: NEGATIVE

## 2014-11-27 LAB — POCT URINALYSIS DIPSTICK
Glucose, UA: NEGATIVE
Leukocytes, UA: NEGATIVE
Nitrite, UA: NEGATIVE
Protein, UA: 30
Spec Grav, UA: 1.015
Urobilinogen, UA: 1
pH, UA: 6

## 2014-11-27 MED ORDER — ONDANSETRON 8 MG PO TBDP: 8.0000 mg | ORAL_TABLET | Freq: Three times a day (TID) | ORAL | Status: DC | PRN

## 2014-11-27 MED ORDER — ONDANSETRON 4 MG PO TBDP
8.0000 mg | ORAL_TABLET | Freq: Once | ORAL | Status: AC
  Administered 2014-11-27: 8 mg via ORAL

## 2014-11-27 MED ORDER — METOPROLOL SUCCINATE ER 50 MG PO TB24: 50.0000 mg | ORAL_TABLET | Freq: Every day | ORAL | Status: DC

## 2014-11-27 NOTE — Patient Instructions (Addendum)
Food Choices to Help Relieve Diarrhea °When you have diarrhea, the foods you eat and your eating habits are very important. Choosing the right foods and drinks can help relieve diarrhea. Also, because diarrhea can last up to 7 days, you need to replace lost fluids and electrolytes (such as sodium, potassium, and chloride) in order to help prevent dehydration.  °WHAT GENERAL GUIDELINES DO I NEED TO FOLLOW? °· Slowly drink 1 cup (8 oz) of fluid for each episode of diarrhea. If you are getting enough fluid, your urine will be clear or pale yellow. °· Eat starchy foods. Some good choices include white rice, white toast, pasta, low-fiber cereal, baked potatoes (without the skin), saltine crackers, and bagels. °· Avoid large servings of any cooked vegetables. °· Limit fruit to two servings per day. A serving is ½ cup or 1 small piece. °· Choose foods with less than 2 g of fiber per serving. °· Limit fats to less than 8 tsp (38 g) per day. °· Avoid fried foods. °· Eat foods that have probiotics in them. Probiotics can be found in certain dairy products. °· Avoid foods and beverages that may increase the speed at which food moves through the stomach and intestines (gastrointestinal tract). Things to avoid include: °¨ High-fiber foods, such as dried fruit, raw fruits and vegetables, nuts, seeds, and whole grain foods. °¨ Spicy foods and high-fat foods. °¨ Foods and beverages sweetened with high-fructose corn syrup, honey, or sugar alcohols such as xylitol, sorbitol, and mannitol. °WHAT FOODS ARE RECOMMENDED? °Grains °White rice. White, French, or pita breads (fresh or toasted), including plain rolls, buns, or bagels. White pasta. Saltine, soda, or graham crackers. Pretzels. Low-fiber cereal. Cooked cereals made with water (such as cornmeal, farina, or cream cereals). Plain muffins. Matzo. Melba toast. Zwieback.  °Vegetables °Potatoes (without the skin). Strained tomato and vegetable juices. Most well-cooked and canned  vegetables without seeds. Tender lettuce. °Fruits °Cooked or canned applesauce, apricots, cherries, fruit cocktail, grapefruit, peaches, pears, or plums. Fresh bananas, apples without skin, cherries, grapes, cantaloupe, grapefruit, peaches, oranges, or plums.  °Meat and Other Protein Products °Baked or boiled chicken. Eggs. Tofu. Fish. Seafood. Smooth peanut butter. Ground or well-cooked tender beef, ham, veal, lamb, pork, or poultry.  °Dairy °Plain yogurt, kefir, and unsweetened liquid yogurt. Lactose-free milk, buttermilk, or soy milk. Plain hard cheese. °Beverages °Sport drinks. Clear broths. Diluted fruit juices (except prune). Regular, caffeine-free sodas such as ginger ale. Water. Decaffeinated teas. Oral rehydration solutions. Sugar-free beverages not sweetened with sugar alcohols. °Other °Bouillon, broth, or soups made from recommended foods.  °The items listed above may not be a complete list of recommended foods or beverages. Contact your dietitian for more options. °WHAT FOODS ARE NOT RECOMMENDED? °Grains °Whole grain, whole wheat, bran, or rye breads, rolls, pastas, crackers, and cereals. Wild or brown rice. Cereals that contain more than 2 g of fiber per serving. Corn tortillas or taco shells. Cooked or dry oatmeal. Granola. Popcorn. °Vegetables °Raw vegetables. Cabbage, broccoli, Brussels sprouts, artichokes, baked beans, beet greens, corn, kale, legumes, peas, sweet potatoes, and yams. Potato skins. Cooked spinach and cabbage. °Fruits °Dried fruit, including raisins and dates. Raw fruits. Stewed or dried prunes. Fresh apples with skin, apricots, mangoes, pears, raspberries, and strawberries.  °Meat and Other Protein Products °Chunky peanut butter. Nuts and seeds. Beans and lentils. Bacon.  °Dairy °High-fat cheeses. Milk, chocolate milk, and beverages made with milk, such as milk shakes. Cream. Ice cream. °Sweets and Desserts °Sweet rolls, doughnuts, and sweet breads. Pancakes   and waffles. °Fats and  Oils °Butter. Cream sauces. Margarine. Salad oils. Plain salad dressings. Olives. Avocados.  °Beverages °Caffeinated beverages (such as coffee, tea, soda, or energy drinks). Alcoholic beverages. Fruit juices with pulp. Prune juice. Soft drinks sweetened with high-fructose corn syrup or sugar alcohols. °Other °Coconut. Hot sauce. Chili powder. Mayonnaise. Gravy. Cream-based or milk-based soups.  °The items listed above may not be a complete list of foods and beverages to avoid. Contact your dietitian for more information. °WHAT SHOULD I DO IF I BECOME DEHYDRATED? °Diarrhea can sometimes lead to dehydration. Signs of dehydration include dark urine and dry mouth and skin. If you think you are dehydrated, you should rehydrate with an oral rehydration solution. These solutions can be purchased at pharmacies, retail stores, or online.  °Drink ½-1 cup (120-240 mL) of oral rehydration solution each time you have an episode of diarrhea. If drinking this amount makes your diarrhea worse, try drinking smaller amounts more often. For example, drink 1-3 tsp (5-15 mL) every 5-10 minutes.  °A general rule for staying hydrated is to drink 1½-2 L of fluid per day. Talk to your health care provider about the specific amount you should be drinking each day. Drink enough fluids to keep your urine clear or pale yellow. °Document Released: 11/08/2003 Document Revised: 08/23/2013 Document Reviewed: 07/11/2013 °ExitCare® Patient Information ©2015 [employer]. This information is not intended to replace advice given to you by your health care provider. Make sure you discuss any questions you have with your health care provider. °Viral Gastroenteritis °Viral gastroenteritis is also known as stomach flu. This condition affects the stomach and intestinal tract. It can cause sudden diarrhea and vomiting. The illness typically lasts 3 to 8 days. Most people develop an immune response that eventually gets rid of the virus. While this natural  response develops, the virus can make you quite ill. °CAUSES  °Many different viruses can cause gastroenteritis, such as rotavirus or noroviruses. You can catch one of these viruses by consuming contaminated food or water. You may also catch a virus by sharing utensils or other personal items with an infected person or by touching a contaminated surface. °SYMPTOMS  °The most common symptoms are diarrhea and vomiting. These problems can cause a severe loss of body fluids (dehydration) and a body salt (electrolyte) imbalance. Other symptoms may include: °· Fever. °· Headache. °· Fatigue. °· Abdominal pain. °DIAGNOSIS  °Your caregiver can usually diagnose viral gastroenteritis based on your symptoms and a physical exam. A stool sample may also be taken to test for the presence of viruses or other infections. °TREATMENT  °This illness typically goes away on its own. Treatments are aimed at rehydration. The most serious cases of viral gastroenteritis involve vomiting so severely that you are not able to keep fluids down. In these cases, fluids must be given through an intravenous line (IV). °HOME CARE INSTRUCTIONS  °· Drink enough fluids to keep your urine clear or pale yellow. Drink small amounts of fluids frequently and increase the amounts as tolerated. °· Ask your caregiver for specific rehydration instructions. °· Avoid: °¨ Foods high in sugar. °¨ Alcohol. °¨ Carbonated drinks. °¨ Tobacco. °¨ Juice. °¨ Caffeine drinks. °¨ Extremely hot or cold fluids. °¨ Fatty, greasy foods. °¨ Too much intake of anything at one time. °¨ Dairy products until 24 to 48 hours after diarrhea stops. °· You may consume probiotics. Probiotics are active cultures of beneficial bacteria. They may lessen the amount and number of diarrheal stools in adults. Probiotics   can be found in yogurt with active cultures and in supplements. °· Wash your hands well to avoid spreading the virus. °· Only take over-the-counter or prescription medicines for  pain, discomfort, or fever as directed by your caregiver. Do not give aspirin to children. Antidiarrheal medicines are not recommended. °· Ask your caregiver if you should continue to take your regular prescribed and over-the-counter medicines. °· Keep all follow-up appointments as directed by your caregiver. °SEEK IMMEDIATE MEDICAL CARE IF:  °· You are unable to keep fluids down. °· You do not urinate at least once every 6 to 8 hours. °· You develop shortness of breath. °· You notice blood in your stool or vomit. This may look like coffee grounds. °· You have abdominal pain that increases or is concentrated in one small area (localized). °· You have persistent vomiting or diarrhea. °· You have a fever. °· The patient is a child younger than 3 months, and he or she has a fever. °· The patient is a child older than 3 months, and he or she has a fever and persistent symptoms. °· The patient is a child older than 3 months, and he or she has a fever and symptoms suddenly get worse. °· The patient is a baby, and he or she has no tears when crying. °MAKE SURE YOU:  °· Understand these instructions. °· Will watch your condition. °· Will get help right away if you are not doing well or get worse. °Document Released: 08/18/2005 Document Revised: 11/10/2011 Document Reviewed: 06/04/2011 °ExitCare® Patient Information ©2015 [employer]. This information is not intended to replace advice given to you by your health care provider. Make sure you discuss any questions you have with your health care provider. ° °

## 2014-11-27 NOTE — Progress Notes (Signed)
[facility] [address], [place] [id number] [phone]  Date:  11/27/2014   Name:  [name] [name]   DOB:  03/09/1981   MRN:  [id number]  PCP:  No PCP Per Patient    Chief Complaint: Emesis; Diarrhea; Headache; and Medication Refill   History of Present Illness:  [name] [name] is a 34 y.o. very pleasant female patient who presents with the following:  Patient reports intermittent nausea and vomiting.  She reports the sxs started 6 days ago, lasted for 4 days.  She then felt well for 2 days.  But this morning, she woke up with emesis, chills, and diarrhea.  She reports 12 episodes of non-bloody, bilious vomiting that started around 4 am this morning.  She has watery diarrhea that contains no blood.  She has not eaten today.  Yesterday she had a deli sandwich.  She reports headache between the eye and a lingering cough from last week.  She reports no dyspnea or sob.  No dysuria, but has been peeing more frequently with her water intake.  She denies any back pain, besides when she is laying on her stomach.   She has two sick contacts with nephew and sister who have both had emesis.    Patient is currently with menstrual cycle.  There are no active problems to display for this patient.   Past Medical History  Diagnosis Date  . Hypertension     Past Surgical History  Procedure Laterality Date  . Cesarean section      History  Substance Use Topics  . Smoking status: Current Every Day Smoker  . Smokeless tobacco: Never Used  . Alcohol Use: Yes     Comment: 3/week    History reviewed. No pertinent family history.  No Known Allergies  Medication list has been reviewed and updated.  Current Outpatient Prescriptions on File Prior to Visit  Medication Sig Dispense Refill  . hydrochlorothiazide (HYDRODIURIL) 25 MG tablet Take 25 mg by mouth daily.    . metoprolol succinate (TOPROL-XL) 50 MG 24 hr tablet Take 50 mg by mouth daily. Take with or immediately  following a meal.     No current facility-administered medications on file prior to visit.    Review of Systems: ROS otherwise unremarkable unless listed above.    Physical Examination: Filed Vitals:   11/27/14 1944  BP: 132/84  Pulse: 80  Temp: 98.7 F (37.1 C)  Resp: 16   Filed Vitals:   11/27/14 1944  Height: 5\' 5"  (1.651 m)  Weight: 189 lb 6.4 oz (85.911 kg)   Body mass index is 31.52 kg/(m^2). Ideal Body Weight: Weight in (lb) to have BMI = 25: 149.9  Physical Exam  Constitutional: She is oriented to person, place, and time. She appears well-developed and well-nourished. No distress.  HENT:  Head: Normocephalic and atraumatic.  Eyes: EOM are normal. Pupils are equal, round, and reactive to light.  Cardiovascular: Normal rate, regular rhythm and normal heart sounds.   No murmur heard. Pulmonary/Chest: Effort normal and breath sounds normal. No respiratory distress. She has no wheezes.  Abdominal: Soft. Bowel sounds are normal. She exhibits no mass. There is no splenomegaly or hepatomegaly. There is no tenderness. There is no rebound, no guarding, no CVA tenderness, no tenderness at McBurney's point and negative Murphy's sign.  Lymphadenopathy:       Head (right side): No preauricular, no posterior auricular and no occipital adenopathy present.       Head (  left side): No preauricular, no posterior auricular and no occipital adenopathy present.    She has no cervical adenopathy.  Neurological: She is alert and oriented to person, place, and time.  Skin: She is not diaphoretic.  Psychiatric: She has a normal mood and affect. Her behavior is normal.    Results for orders placed or performed in visit on 11/27/14  POCT UA - Microscopic Only  Result Value Ref Range   WBC, Ur, HPF, POC 1-4    RBC, urine, microscopic 1-2    Bacteria, U Microscopic 2+    Mucus, UA neg    Epithelial cells, urine per micros 7-30    Crystals, Ur, HPF, POC neg    Casts, Ur, LPF, POC neg     Yeast, UA neg   POCT urinalysis dipstick  Result Value Ref Range   Color, UA yellow    Clarity, UA cloudy    Glucose, UA neg    Bilirubin, UA small    Ketones, UA trace    Spec Grav, UA 1.015    Blood, UA mod    pH, UA 6.0    Protein, UA 30    Urobilinogen, UA 1.0    Nitrite, UA neg    Leukocytes, UA Negative      Assessment and Plan: 34 year old female is here today with nausea, vomiting, headache, and diarrhea.  This is likely gastroenteritis of viral etiology.  Gastroenteritis -Ordered zofran for nausea  Nausea and vomiting, vomiting of unspecified type - Plan: ondansetron (ZOFRAN-ODT) disintegrating tablet 8 mg, POCT UA - Microscopic Only, POCT urinalysis dipstick, ondansetron (ZOFRAN-ODT) 8 MG disintegrating tablet  Urinary frequency  - Plan: POCT UA - Microscopic Only, POCT urinalysis dipstick  Essential hypertension  - Plan: metoprolol succinate (TOPROL-XL) 50 MG 24 hr tablet  Trena Platt, PA-C [facility] 3/28/20169:05 PM

## 2014-11-27 NOTE — Addendum Note (Signed)
Addended by: Trena PlattENGLISH, STEPHANIE D on: 11/27/2014 09:47 PM   Modules accepted: Level of Service

## 2015-08-20 IMAGING — CT CT HEAD W/O CM
2 series · 16 of 30 positions shown, 20 images · non-contrast
Comparison: None

CLINICAL DATA: Syncopal episode.

EXAM:
CT HEAD WITHOUT CONTRAST
TECHNIQUE: Contiguous axial images were obtained from the base of the skull
through the vertex without contrast.

[Series 2: head w/o · axial · non-contrast · 0.43mm/px · z∈[+1426,+1546]mm · 13 of 30 slices shown, 17 images]
[im 3/30  brain]
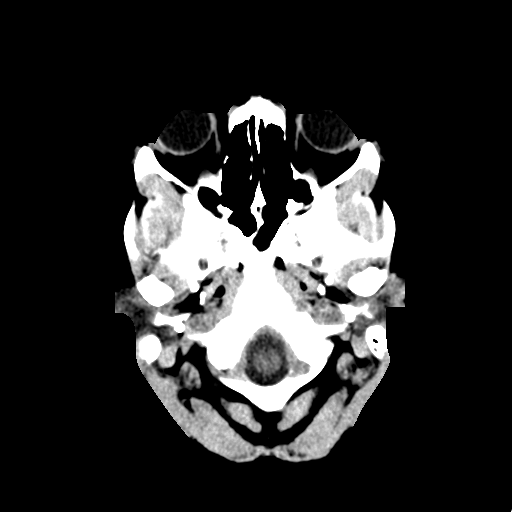
[im 3/30  bone]
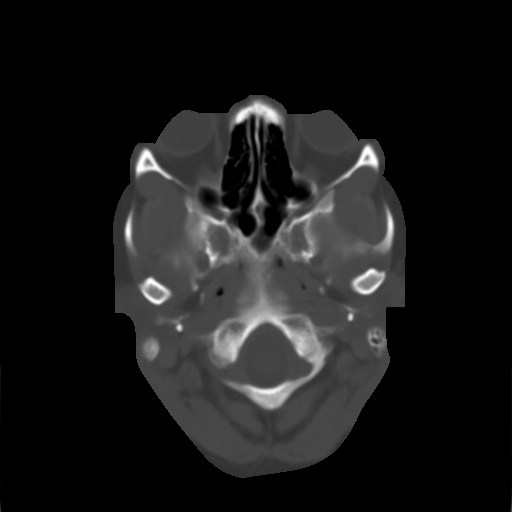
[im 5/30  brain]
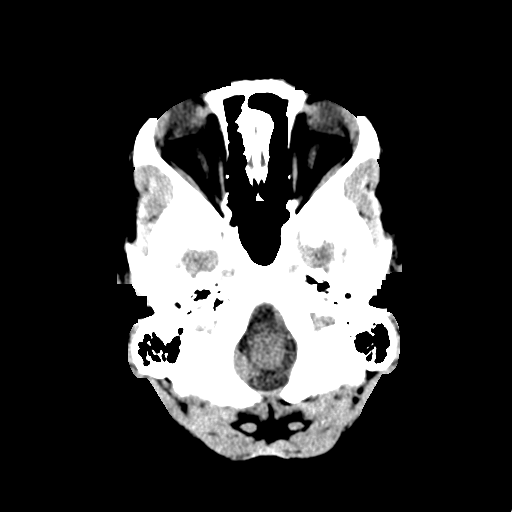
[im 7/30  brain]
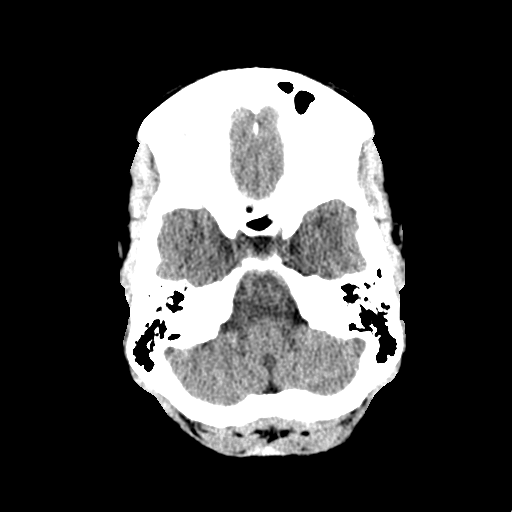
[im 9/30  brain]
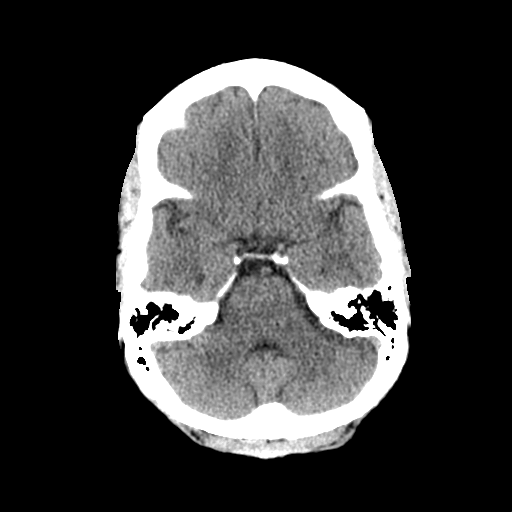
[im 11/30  brain]
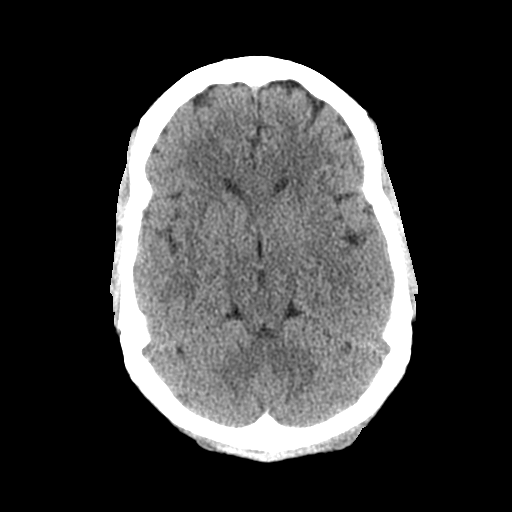
[im 11/30  bone]
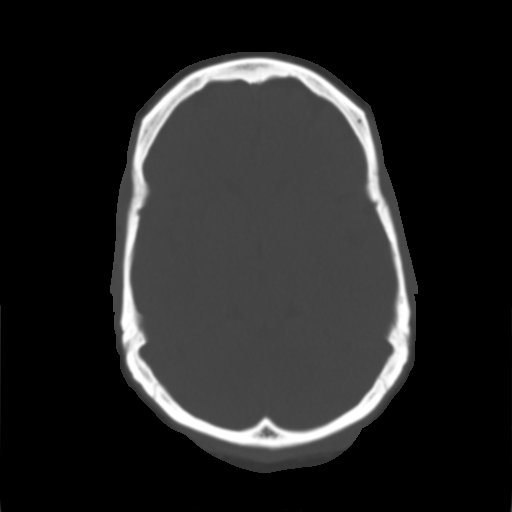
[im 13/30  brain]
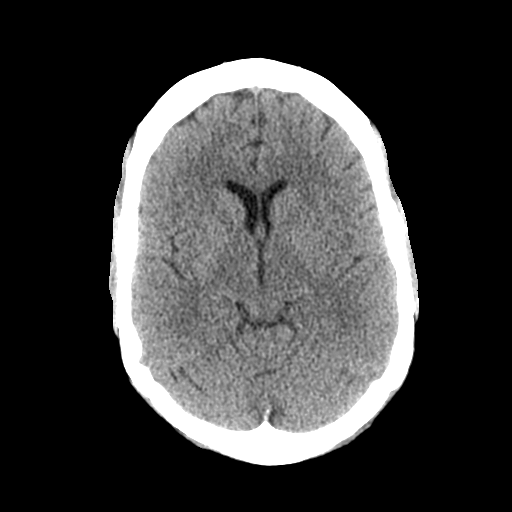
[im 15/30  brain]
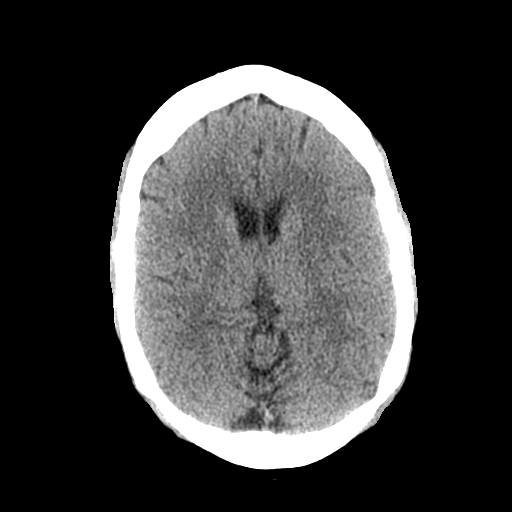
[im 17/30  brain]
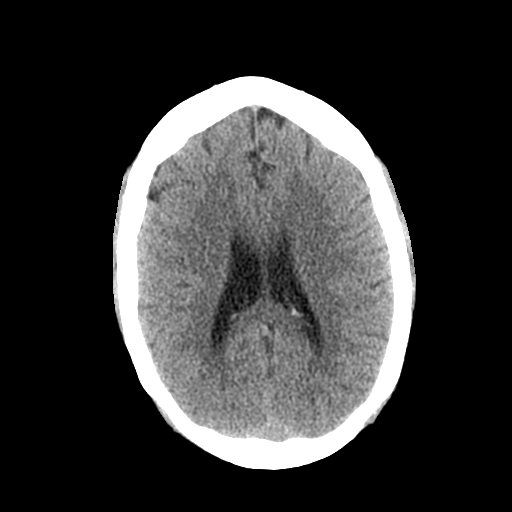
[im 19/30  brain]
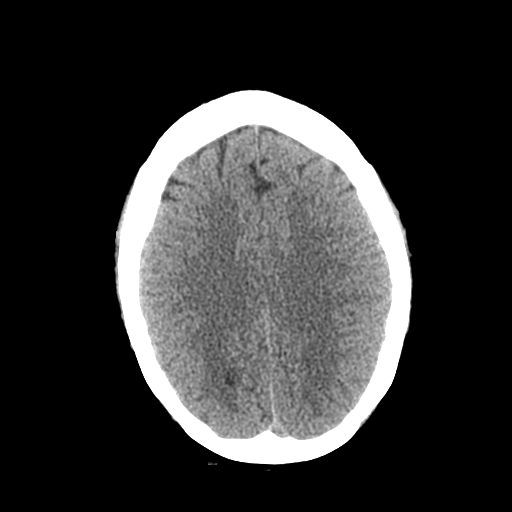
[im 19/30  bone]
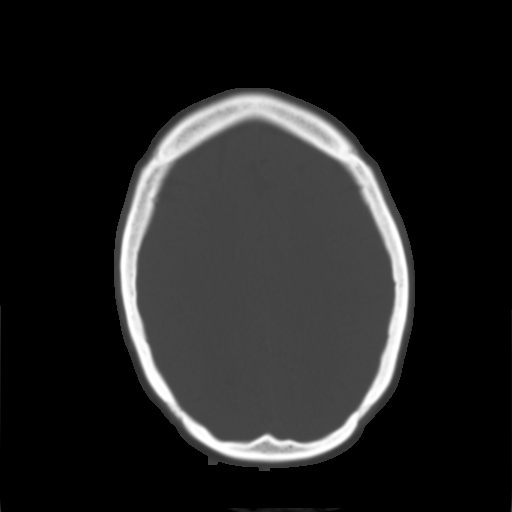
[im 21/30  brain]
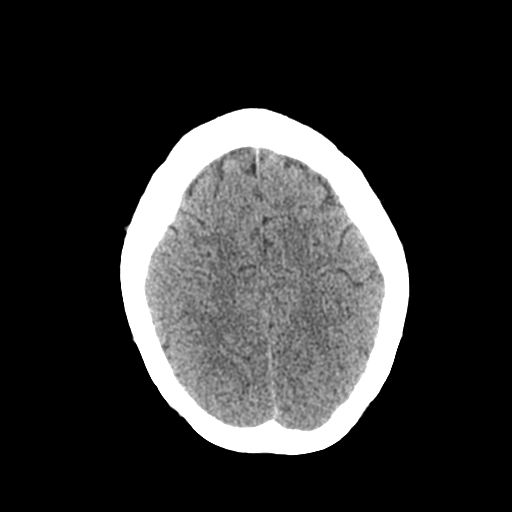
[im 23/30  brain]
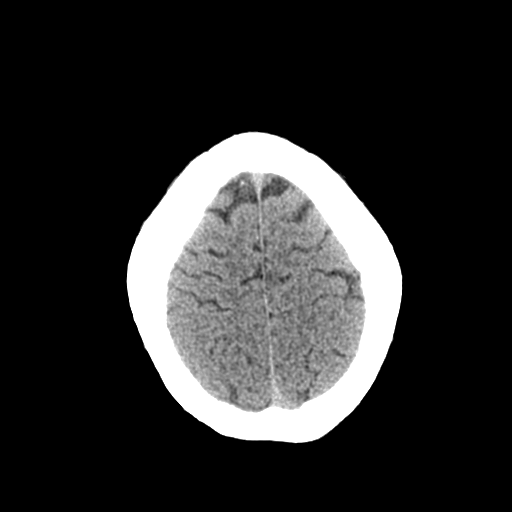
[im 25/30  brain]
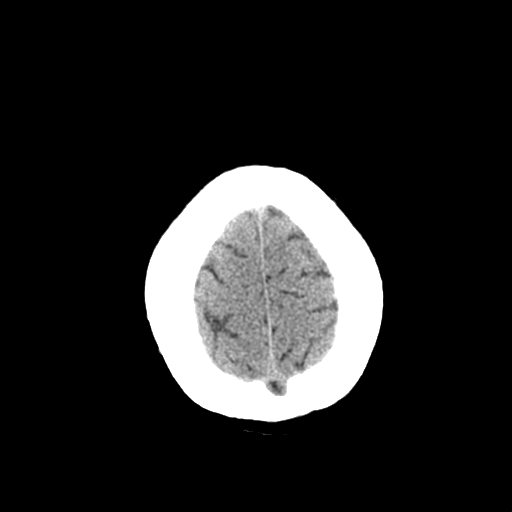
[im 27/30  brain]
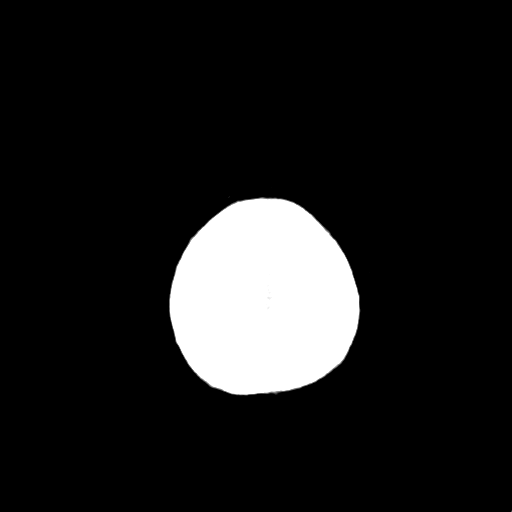
[im 27/30  bone]
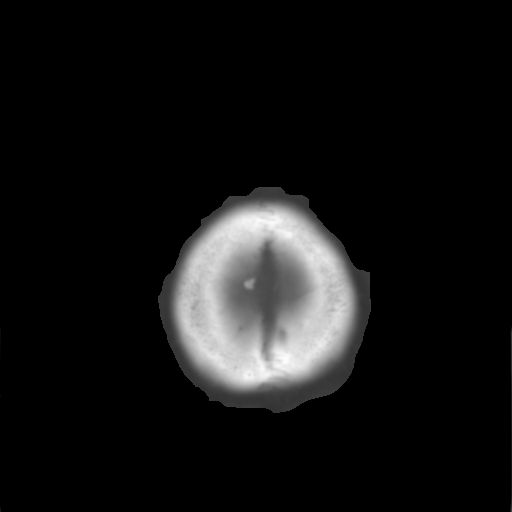

[Series 3: bone windows · axial · 0.43mm/px · z∈[+1426,+1466]mm · 3 of 30 slices shown]
[im 3/30  bone]
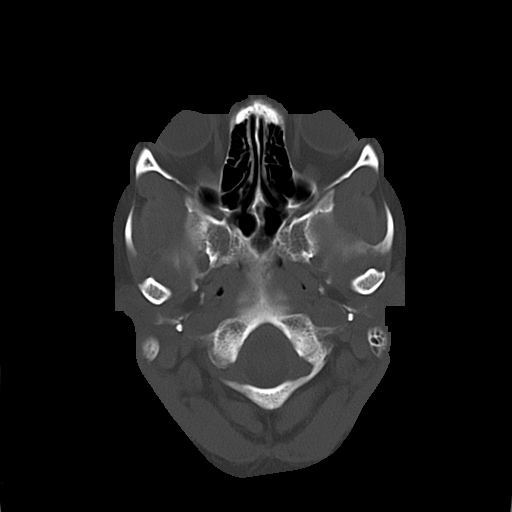
[im 7/30  bone]
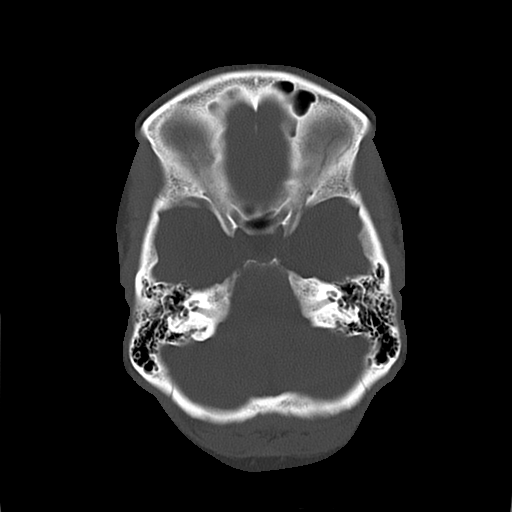
[im 11/30  bone]
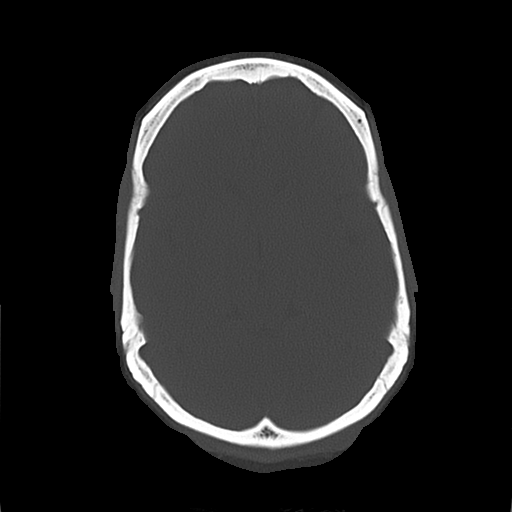

[16 of 30 positions shown; findings below may reference images not displayed]

FINDINGS: No evidence for acute hemorrhage, mass lesion, midline shift,
hydrocephalus or large infarct. There is a large polyp or retention
cyst in the right maxillary sinus. No acute bone abnormality.
IMPRESSION: No acute intracranial abnormality.

Right maxillary sinus polyp.

## 2016-01-15 ENCOUNTER — Other Ambulatory Visit: Payer: Self-pay | Admitting: Obstetrics and Gynecology

## 2016-01-16 LAB — CYTOLOGY - PAP

## 2016-08-23 ENCOUNTER — Emergency Department (HOSPITAL_COMMUNITY): Payer: Self-pay

## 2016-08-23 ENCOUNTER — Emergency Department (HOSPITAL_COMMUNITY)
Admission: EM | Admit: 2016-08-23 | Discharge: 2016-08-23 | Disposition: A | Discharge status: Home / Self Care | Payer: Self-pay | Attending: Emergency Medicine | Admitting: Emergency Medicine

## 2016-08-23 ENCOUNTER — Encounter (HOSPITAL_COMMUNITY): Payer: Self-pay | Admitting: Emergency Medicine

## 2016-08-23 DIAGNOSIS — R0789 Other chest pain: Secondary | ICD-10-CM | POA: Insufficient documentation

## 2016-08-23 DIAGNOSIS — I10 Essential (primary) hypertension: Secondary | ICD-10-CM | POA: Insufficient documentation

## 2016-08-23 DIAGNOSIS — D649 Anemia, unspecified: Secondary | ICD-10-CM | POA: Insufficient documentation

## 2016-08-23 DIAGNOSIS — F17210 Nicotine dependence, cigarettes, uncomplicated: Secondary | ICD-10-CM | POA: Insufficient documentation

## 2016-08-23 LAB — CBC WITH DIFFERENTIAL/PLATELET
Basophils Absolute: 0 10*3/uL (ref 0.0–0.1)
Basophils Relative: 0 %
EOS PCT: 2 %
Eosinophils Absolute: 0.3 10*3/uL (ref 0.0–0.7)
HEMATOCRIT: 28.5 % — AB (ref 36.0–46.0)
Hemoglobin: 8.2 g/dL — ABNORMAL LOW (ref 12.0–15.0)
LYMPHS PCT: 24 %
Lymphs Abs: 3.4 10*3/uL (ref 0.7–4.0)
MCH: 15.3 pg — AB (ref 26.0–34.0)
MCHC: 28.8 g/dL — AB (ref 30.0–36.0)
MCV: 53.3 fL — AB (ref 78.0–100.0)
MONO ABS: 0.6 10*3/uL (ref 0.1–1.0)
MONOS PCT: 4 %
NEUTROS ABS: 10 10*3/uL — AB (ref 1.7–7.7)
Neutrophils Relative %: 70 %
PLATELETS: 489 10*3/uL — AB (ref 150–400)
RBC: 5.35 MIL/uL — ABNORMAL HIGH (ref 3.87–5.11)
RDW: 20.6 % — AB (ref 11.5–15.5)
WBC: 14.3 10*3/uL — ABNORMAL HIGH (ref 4.0–10.5)

## 2016-08-23 LAB — COMPREHENSIVE METABOLIC PANEL
ALBUMIN: 3.9 g/dL (ref 3.5–5.0)
ALK PHOS: 76 U/L (ref 38–126)
ALT: 27 U/L (ref 14–54)
ANION GAP: 9 (ref 5–15)
AST: 20 U/L (ref 15–41)
BILIRUBIN TOTAL: 0.3 mg/dL (ref 0.3–1.2)
BUN: 17 mg/dL (ref 6–20)
CO2: 26 mmol/L (ref 22–32)
Calcium: 8.8 mg/dL — ABNORMAL LOW (ref 8.9–10.3)
Chloride: 103 mmol/L (ref 101–111)
Creatinine, Ser: 0.8 mg/dL (ref 0.44–1.00)
GFR calc Af Amer: >60 mL/min (ref >60)
GFR calc non Af Amer: >60 mL/min (ref >60)
GLUCOSE: 108 mg/dL — AB (ref 65–99)
POTASSIUM: 2.9 mmol/L — AB (ref 3.5–5.1)
SODIUM: 138 mmol/L (ref 135–145)
Total Protein: 7.2 g/dL (ref 6.5–8.1)

## 2016-08-23 LAB — I-STAT TROPONIN, ED
TROPONIN I, POC: 0.01 ng/mL (ref 0.00–0.08)
Troponin i, poc: 0 ng/mL (ref 0.00–0.08)

## 2016-08-23 MED ORDER — METOPROLOL SUCCINATE ER 50 MG PO TB24: 50.0000 mg | ORAL_TABLET | Freq: Every day | ORAL | 0 refills | Status: DC

## 2016-08-23 MED ORDER — POTASSIUM CHLORIDE CRYS ER 20 MEQ PO TBCR
40.0000 meq | EXTENDED_RELEASE_TABLET | Freq: Once | ORAL | Status: AC
  Administered 2016-08-23: 40 meq via ORAL
  Filled 2016-08-23: qty 2

## 2016-08-23 MED ORDER — FERROUS SULFATE DRIED ER 160 (50 FE) MG PO TBCR: 1.0000 | EXTENDED_RELEASE_TABLET | Freq: Every day | ORAL | 0 refills | Status: DC

## 2016-08-23 NOTE — ED Triage Notes (Signed)
Patient complaining of chest pain that started 30 minutes ago in mid chest and left chest. Patient states that her sight was going in and out while her heart was beating. No shortness of breathe. Patient has not taken any medications.

## 2016-08-23 NOTE — ED Provider Notes (Signed)
[facility]-EMERGENCY DEPT Provider Note   CSN: [id number] Arrival date & time: 08/23/16  0115     History   Chief Complaint Chief Complaint  Patient presents with  . Chest Pain    HPI [name] [name] is a 35 y.o. female.  The history is provided by the patient. No language interpreter was used.  Chest Pain      [name] [name] is a 35 y.o. female who presents to the Emergency Department complaining of chest pain.  She reports left upper chest pain that radiates to her back that started at 1 AM. The symptoms were waxing and waning and or episodes that lasted 3-4 minutes at a time and total pain duration was about 15 minutes. The pain is described as a discomfort type sensation. She has associated tingling in bilateral hands. She denies any nausea, diaphoresis, abdominal pain, shortness of breath, leg swelling or pain. No prior similar symptoms. She has a history of hypertension but has been off her medications for the last 3 months. She takes no birth control and has no history of blood clots. Symptoms are currently resolved. There is a family history of hypertension and diabetes.  Past Medical History:  Diagnosis Date  . Hypertension     There are no active problems to display for this patient.   Past Surgical History:  Procedure Laterality Date  . CESAREAN SECTION      OB History    No data available       Home Medications    Prior to Admission medications   Medication Sig Start Date End Date Taking? Authorizing Provider  FLUoxetine (PROZAC) 10 MG tablet Take 10 mg by mouth daily.   Yes Historical Provider, MD  hydrochlorothiazide (HYDRODIURIL) 25 MG tablet Take 25 mg by mouth daily.   Yes Historical Provider, MD  ferrous sulfate (SLOW RELEASE IRON) 160 (50 Fe) MG TBCR SR tablet Take 1 tablet (160 mg total) by mouth daily. 08/23/16   Tilden FossaElizabeth Aamna Mallozzi, MD  metoprolol succinate (TOPROL-XL) 50 MG 24 hr tablet Take 1 tablet (50 mg total) by mouth daily. Take with or immediately  following a meal. 08/23/16   Tilden FossaElizabeth Grete Bosko, MD  ondansetron (ZOFRAN-ODT) 8 MG disintegrating tablet Take 1 tablet (8 mg total) by mouth every 8 (eight) hours as needed for nausea. Patient not taking: Reported on 08/23/2016 11/27/14   Garnetta BuddyStephanie D English, PA    Family History History reviewed. No pertinent family history.  Social History Social History  Substance Use Topics  . Smoking status: Current Every Day Smoker  . Smokeless tobacco: Never Used  . Alcohol use Yes     Comment: 3/week     Allergies   Patient has no known allergies.   Review of Systems Review of Systems  Cardiovascular: Positive for chest pain.  All other systems reviewed and are negative.    Physical Exam Updated Vital Signs BP 181/94   Pulse 79   Temp 98.1 F (36.7 C) (Oral)   Resp 23   Ht 5\' 3"  (1.6 m)   Wt 178 lb (80.7 kg)   LMP 08/21/2016 (Approximate)   SpO2 97%   BMI 31.53 kg/m   Physical Exam  Constitutional: She is oriented to person, place, and time. She appears well-developed and well-nourished.  HENT:  Head: Normocephalic and atraumatic.  Cardiovascular: Normal rate and regular rhythm.   No murmur heard. Pulmonary/Chest: Effort normal and breath sounds normal. No respiratory distress.  Abdominal: Soft. There is no tenderness. There is no rebound  and no guarding.  Musculoskeletal: She exhibits no edema or tenderness.  Neurological: She is alert and oriented to person, place, and time.  Skin: Skin is warm and dry.  Psychiatric: She has a normal mood and affect. Her behavior is normal.  Nursing note and vitals reviewed.    ED Treatments / Results  Labs (all labs ordered are listed, but only abnormal results are displayed) Labs Reviewed  COMPREHENSIVE METABOLIC PANEL - Abnormal; Notable for the following:       Result Value   Potassium 2.9 (*)    Glucose, Bld 108 (*)    Calcium 8.8 (*)    All other components within normal limits  CBC WITH DIFFERENTIAL/PLATELET - Abnormal;  Notable for the following:    WBC 14.3 (*)    RBC 5.35 (*)    Hemoglobin 8.2 (*)    HCT 28.5 (*)    MCV 53.3 (*)    MCH 15.3 (*)    MCHC 28.8 (*)    RDW 20.6 (*)    Platelets 489 (*)    Neutro Abs 10.0 (*)    All other components within normal limits  I-STAT TROPOININ, ED  I-STAT TROPOININ, ED    EKG  EKG Interpretation  Date/Time:  Saturday August 23 2016 01:29:44 EST Ventricular Rate:  93 PR Interval:    QRS Duration: 94 QT Interval:  369 QTC Calculation: 459 R Axis:   -76 Text Interpretation:  Sinus rhythm Left anterior fascicular block Probable left ventricular hypertrophy Confirmed by Lincoln Brigham [id number]) on 08/23/2016 4:57:40 AM       Radiology Dg Chest 2 View  Result Date: 08/23/2016 CLINICAL DATA:  Chest pain, onset 30 minutes prior to arrival. EXAM: CHEST  2 VIEW COMPARISON:  03/08/2014 FINDINGS: The heart size and mediastinal contours are within normal limits. Both lungs are clear. The visualized skeletal structures are unremarkable. IMPRESSION: No active cardiopulmonary disease. Electronically Signed   By: Ellery Plunk M.D.   On: 08/23/2016 01:43    Procedures Procedures (including critical care time)  Medications Ordered in ED Medications  potassium chloride SA (K-DUR,KLOR-CON) CR tablet 40 mEq (40 mEq Oral Given 08/23/16 0524)     Initial Impression / Assessment and Plan / ED Course  I have reviewed the triage vital signs and the nursing notes.  Pertinent labs & imaging results that were available during my care of the patient were reviewed by me and considered in my medical decision making (see chart for details).  Clinical Course     Patient here for evaluation of chest pain that occurred prior to ED arrival and pain is now resolved. She has a history of hypertension and is not on any current medications. Presentation is not consistent with ACS, hypertensive urgency, dissection, PE. Discussed the patient home care for hypertension, chest pain.  Patient is noted to be anemic in the emergency department, does not appear symptomatically at this time. She does endorse history of heavy menstrual cycles. We will initiate iron replacement pending PCP follow-up. Discussed outpatient follow-up and return precautions.  Final Clinical Impressions(s) / ED Diagnoses   Final diagnoses:  Atypical chest pain  Anemia, unspecified type    New Prescriptions Discharge Medication List as of 08/23/2016  6:07 AM    START taking these medications   Details  ferrous sulfate (SLOW RELEASE IRON) 160 (50 Fe) MG TBCR SR tablet Take 1 tablet (160 mg total) by mouth daily., Starting Sat 08/23/2016, Print  Tilden FossaElizabeth Rhylie Stehr, MD 08/23/16 [phone]

## 2017-06-16 DIAGNOSIS — Z1339 Encounter for screening examination for other mental health and behavioral disorders: Secondary | ICD-10-CM | POA: Insufficient documentation

## 2017-06-16 DIAGNOSIS — N920 Excessive and frequent menstruation with regular cycle: Secondary | ICD-10-CM | POA: Diagnosis present

## 2017-06-16 DIAGNOSIS — N3941 Urge incontinence: Secondary | ICD-10-CM | POA: Insufficient documentation

## 2017-06-16 DIAGNOSIS — F17200 Nicotine dependence, unspecified, uncomplicated: Secondary | ICD-10-CM | POA: Insufficient documentation

## 2017-06-16 HISTORY — DX: Excessive and frequent menstruation with regular cycle: N92.0

## 2018-09-02 ENCOUNTER — Encounter: Payer: Self-pay | Admitting: Emergency Medicine

## 2018-09-02 ENCOUNTER — Ambulatory Visit
Admission: EM | Admit: 2018-09-02 | Discharge: 2018-09-02 | Disposition: A | Discharge status: Home / Self Care | Payer: Self-pay

## 2018-09-02 DIAGNOSIS — K0889 Other specified disorders of teeth and supporting structures: Secondary | ICD-10-CM | POA: Insufficient documentation

## 2018-09-02 MED ORDER — DICLOFENAC SODIUM 75 MG PO TBEC: 75.0000 mg | DELAYED_RELEASE_TABLET | Freq: Two times a day (BID) | ORAL | 0 refills | Status: DC

## 2018-09-02 MED ORDER — CLINDAMYCIN HCL 300 MG PO CAPS: 300.0000 mg | ORAL_CAPSULE | Freq: Four times a day (QID) | ORAL | 0 refills | Status: DC

## 2018-09-02 NOTE — ED Provider Notes (Signed)
[facility]    CSN: [id number] Arrival date & time: 09/02/18  1129     History   Chief Complaint Chief Complaint  Patient presents with  . Dental Pain    HPI [name] [name] is a 38 y.o. female.   The history is provided by the patient. No language interpreter was used.  Dental Pain  Location:  Lower Lower teeth location:  19/LL 1st molar Quality:  Aching and constant Severity:  Moderate Timing:  Constant Progression:  Worsening Chronicity:  New Relieved by:  Nothing Worsened by:  Nothing Ineffective treatments:  None tried Associated symptoms: gum swelling     Past Medical History:  Diagnosis Date  . Hypertension     There are no active problems to display for this patient.   Past Surgical History:  Procedure Laterality Date  . CESAREAN SECTION      OB History   No obstetric history on file.      Home Medications    Prior to Admission medications   Medication Sig Start Date End Date Taking? Authorizing Provider  benzocaine (ORAJEL) 10 % mucosal gel Use as directed 1 application in the mouth or throat as needed for mouth pain.   Yes [provider]  clindamycin (CLEOCIN) 300 MG capsule Take 1 capsule (300 mg total) by mouth every 6 (six) hours. 09/02/18   Elson Areas, PA-C  diclofenac (VOLTAREN) 75 MG EC tablet Take 1 tablet (75 mg total) by mouth 2 (two) times daily. 09/02/18   Elson Areas, PA-C  ferrous sulfate (SLOW RELEASE IRON) 160 (50 Fe) MG TBCR SR tablet Take 1 tablet (160 mg total) by mouth daily. 08/23/16   Tilden Fossa, MD  FLUoxetine (PROZAC) 10 MG tablet Take 10 mg by mouth daily.    [provider]  hydrochlorothiazide (HYDRODIURIL) 25 MG tablet Take 25 mg by mouth daily.    [provider]  metoprolol succinate (TOPROL-XL) 50 MG 24 hr tablet Take 1 tablet (50 mg total) by mouth daily. Take with or immediately following a meal. 08/23/16   Tilden Fossa, MD  ondansetron (ZOFRAN-ODT) 8 MG  disintegrating tablet Take 1 tablet (8 mg total) by mouth every 8 (eight) hours as needed for nausea. Patient not taking: Reported on 08/23/2016 11/27/14   Garnetta Buddy, PA    Family History History reviewed. No pertinent family history.  Social History Social History   Tobacco Use  . Smoking status: Current Every Day Smoker    Packs/day: 0.10  . Smokeless tobacco: Never Used  Substance Use Topics  . Alcohol use: Yes    Comment: 3/week  . Drug use: No     Allergies   Patient has no known allergies.   Review of Systems Review of Systems  All other systems reviewed and are negative.    Physical Exam Triage Vital Signs ED Triage Vitals  Enc Vitals Group     BP 09/02/18 1138 (!) 208/124     Pulse Rate 09/02/18 1138 78     Resp 09/02/18 1138 18     Temp 09/02/18 1138 97.8 F (36.6 C)     Temp Source 09/02/18 1138 Oral     SpO2 09/02/18 1138 100 %     Weight --      Height --      Head Circumference --      Peak Flow --      Pain Score 09/02/18 1139 8     Pain Loc --  Pain Edu? --      Excl. in [place]? --    No data found.  Updated Vital Signs BP (!) 208/124 (BP Location: Right Arm)   Pulse 78   Temp 97.8 F (36.6 C) (Oral)   Resp 18   LMP 08/01/2018   SpO2 100%   Visual Acuity Right Eye Distance:   Left Eye Distance:   Bilateral Distance:    Right Eye Near:   Left Eye Near:    Bilateral Near:     Physical Exam Vitals signs and nursing note reviewed.  Constitutional:      Appearance: She is well-developed.  HENT:     Head: Normocephalic.     Right Ear: External ear normal.     Left Ear: External ear normal.     Mouth/Throat:     Comments: Dental decay,  Broken left 1st molar  Neck:     Musculoskeletal: Normal range of motion.  Cardiovascular:     Rate and Rhythm: Normal rate.  Pulmonary:     Effort: Pulmonary effort is normal.  Abdominal:     General: There is no distension.  Musculoskeletal: Normal range of motion.  Skin:     General: Skin is warm.  Neurological:     Mental Status: She is alert and oriented to person, place, and time.  Psychiatric:        Mood and Affect: Mood normal.      UC Treatments / Results  Labs (all labs ordered are listed, but only abnormal results are displayed) Labs Reviewed - No data to display  EKG None  Radiology No results found.  Procedures Procedures (including critical care time)  Medications Ordered in UC Medications - No data to display  Initial Impression / Assessment and Plan / UC Course  I have reviewed the triage vital signs and the nursing notes.  Pertinent labs & imaging results that were available during my care of the patient were reviewed by me and considered in my medical decision making (see chart for details).      Final Clinical Impressions(s) / UC Diagnoses   Final diagnoses:  Toothache   Discharge Instructions   None    ED Prescriptions    Medication Sig Dispense Auth. Provider   clindamycin (CLEOCIN) 300 MG capsule Take 1 capsule (300 mg total) by mouth every 6 (six) hours. 28 capsule Nikkolas Coomes K, [place]   diclofenac (VOLTAREN) 75 MG EC tablet Take 1 tablet (75 mg total) by mouth 2 (two) times daily. 20 tablet Elson AreasSofia, Jonas Goh K, [place]-C     Controlled Substance Prescriptions [place] Controlled Substance Registry consulted? Not Applicable   An After Visit Summary was printed and given to the patient. Pt given dental resource guide    Elson AreasSofia, Adeola Dennen K, [place] 09/02/18 1344

## 2018-09-02 NOTE — ED Triage Notes (Signed)
Pt presents to [facility] for assessment of left lower dental pain, intermittent x 1 year, sts she has a broken tooth in that area.  States over the past few weeks swelling and increase in pain has occurred.

## 2018-09-02 NOTE — ED Notes (Signed)
Patient able to ambulate independently  

## 2020-05-05 ENCOUNTER — Other Ambulatory Visit: Payer: Self-pay

## 2020-05-05 ENCOUNTER — Ambulatory Visit
Admission: EM | Admit: 2020-05-05 | Discharge: 2020-05-05 | Disposition: A | Discharge status: Home / Self Care | Payer: Self-pay | Attending: Physician Assistant | Admitting: Physician Assistant

## 2020-05-05 DIAGNOSIS — R350 Frequency of micturition: Secondary | ICD-10-CM

## 2020-05-05 DIAGNOSIS — K0889 Other specified disorders of teeth and supporting structures: Secondary | ICD-10-CM

## 2020-05-05 DIAGNOSIS — I10 Essential (primary) hypertension: Secondary | ICD-10-CM

## 2020-05-05 LAB — POCT URINALYSIS DIP (MANUAL ENTRY)
Bilirubin, UA: NEGATIVE
Blood, UA: NEGATIVE
Glucose, UA: NEGATIVE mg/dL
Ketones, POC UA: NEGATIVE mg/dL
Leukocytes, UA: NEGATIVE
Nitrite, UA: NEGATIVE
Protein Ur, POC: NEGATIVE mg/dL
Spec Grav, UA: 1.02 (ref 1.010–1.025)
Urobilinogen, UA: 0.2 E.U./dL
pH, UA: 7 (ref 5.0–8.0)

## 2020-05-05 MED ORDER — FLUCONAZOLE 150 MG PO TABS: 150.0000 mg | ORAL_TABLET | Freq: Every day | ORAL | 0 refills | Status: DC

## 2020-05-05 MED ORDER — HYDROCHLOROTHIAZIDE 25 MG PO TABS: 25.0000 mg | ORAL_TABLET | Freq: Every day | ORAL | 0 refills | Status: DC

## 2020-05-05 MED ORDER — AMOXICILLIN-POT CLAVULANATE 875-125 MG PO TABS: 1.0000 | ORAL_TABLET | Freq: Two times a day (BID) | ORAL | 0 refills | Status: DC

## 2020-05-05 NOTE — ED Provider Notes (Signed)
[facility]    CSN: [id number] Arrival date & time: 05/05/20  0904      History   Chief Complaint Chief Complaint  Patient presents with   Dental Pain   Dysuria    HPI [name] [name] is a 39 y.o. female.   39 year old female comes in for multiple complaints  1.  1 week history of urinary urgency, frequency.  Denies dysuria, hematuria.  Denies abdominal pain, nausea, vomiting, diarrhea.  Denies fever, chills, flank pain.  Mild vaginal irritation/itching without obvious discharge.  LMP 04/08/2020.  No changes in hygiene product.  2.  Right upper dental pain.  Has had intermittently for the past 4 months, states has been worse and more consistent the past few days.  Has known chipped tooth without new injury.  Patient was found hypertensive at 221/134 at triage.  Denies any chest pain, blurred vision, headache, shortness of breath, dizziness.  History of gestational hypertension.  States has had intermittent elevated blood pressure read since delivery, but has never been managed by primary care.     Past Medical History:  Diagnosis Date   Hypertension     There are no problems to display for this patient.   Past Surgical History:  Procedure Laterality Date   CESAREAN SECTION      OB History   No obstetric history on file.      Home Medications    Prior to Admission medications   Medication Sig Start Date End Date Taking? Authorizing Provider  amoxicillin-clavulanate (AUGMENTIN) 875-125 MG tablet Take 1 tablet by mouth every 12 (twelve) hours. 05/05/20   Cathie Hoops, Nathaneal Sommers V, PA-C  benzocaine (ORAJEL) 10 % mucosal gel Use as directed 1 application in the mouth or throat as needed for mouth pain.    [provider]  diclofenac (VOLTAREN) 75 MG EC tablet Take 1 tablet (75 mg total) by mouth 2 (two) times daily. 09/02/18   Elson Areas, PA-C  ferrous sulfate (SLOW RELEASE IRON) 160 (50 Fe) MG TBCR SR tablet Take 1 tablet (160 mg total) by mouth daily.  08/23/16   Tilden Fossa, MD  fluconazole (DIFLUCAN) 150 MG tablet Take 1 tablet (150 mg total) by mouth daily. Take second dose 72 hours later if symptoms still persists. 05/05/20   Cathie Hoops, Tammie Ellsworth V, PA-C  hydrochlorothiazide (HYDRODIURIL) 25 MG tablet Take 1 tablet (25 mg total) by mouth daily. 05/05/20   Cathie Hoops, Keegan Bensch V, PA-C  ondansetron (ZOFRAN-ODT) 8 MG disintegrating tablet Take 1 tablet (8 mg total) by mouth every 8 (eight) hours as needed for nausea. Patient not taking: Reported on 08/23/2016 11/27/14   Trena Platt D, PA  FLUoxetine (PROZAC) 10 MG tablet Take 10 mg by mouth daily.  05/05/20  [provider]  metoprolol succinate (TOPROL-XL) 50 MG 24 hr tablet Take 1 tablet (50 mg total) by mouth daily. Take with or immediately following a meal. 08/23/16 05/05/20  Tilden Fossa, MD    Family History Family History  Problem Relation Age of Onset   Hypertension Mother    Diabetes Mother    Diabetes Father     Social History Social History   Tobacco Use   Smoking status: Current Every Day Smoker    Packs/day: 0.10   Smokeless tobacco: Never Used  Substance Use Topics   Alcohol use: Yes    Comment: 3/week   Drug use: No     Allergies   Latex   Review of Systems Review of Systems  Reason unable to  perform ROS: See HPI as above.     Physical Exam Triage Vital Signs ED Triage Vitals  Enc Vitals Group     BP 05/05/20 1013 (!) 221/134     Pulse Rate 05/05/20 1013 78     Resp 05/05/20 1013 16     Temp 05/05/20 1013 98.2 F (36.8 C)     Temp Source 05/05/20 1013 Oral     SpO2 05/05/20 1013 99 %     Weight --      Height --      Head Circumference --      Peak Flow --      Pain Score 05/05/20 1032 8     Pain Loc --      Pain Edu? --      Excl. in GC? --    No data found.  Updated Vital Signs BP (!) 221/134 (BP Location: Right Arm)    Pulse 78    Temp 98.2 F (36.8 C) (Oral)    Resp 16    LMP 04/08/2020    SpO2 99%   Physical Exam Constitutional:        General: She is not in acute distress.    Appearance: Normal appearance. She is well-developed. She is not ill-appearing, toxic-appearing or diaphoretic.  HENT:     Head: Normocephalic and atraumatic.     Jaw: No trismus.     Mouth/Throat:     Mouth: Mucous membranes are moist.     Pharynx: Oropharynx is clear. Uvula midline. No uvula swelling.     Tonsils: No tonsillar exudate.     Comments: 2 cracked tooth to the right upper jaw. Mild tenderness to palpation. No swelling/fluctuance.  Floor of mouth soft to palpation. No facial swelling.  Eyes:     Conjunctiva/sclera: Conjunctivae normal.     Pupils: Pupils are equal, round, and reactive to light.  Cardiovascular:     Rate and Rhythm: Normal rate and regular rhythm.  Pulmonary:     Effort: Pulmonary effort is normal. No respiratory distress.     Comments: LCTAB Musculoskeletal:     Cervical back: Normal range of motion and neck supple.  Skin:    General: Skin is warm and dry.  Neurological:     Mental Status: She is alert and oriented to person, place, and time.     Comments: No gross deficits      UC Treatments / Results  Labs (all labs ordered are listed, but only abnormal results are displayed) Labs Reviewed  POCT URINALYSIS DIP (MANUAL ENTRY)    EKG   Radiology No results found.  Procedures Procedures (including critical care time)  Medications Ordered in UC Medications - No data to display  Initial Impression / Assessment and Plan / UC Course  I have reviewed the triage vital signs and the nursing notes.  Pertinent labs & imaging results that were available during my care of the patient were reviewed by me and considered in my medical decision making (see chart for details).    1.  Dental pain Start antibiotics for possible dental infection. Symptomatic treatment as needed. Discussed with patient symptoms can return if dental problem is not addressed. Follow up with dentist for further evaluation  and treatment of dental pain. Resources given. Return precautions given.   2.  Urinary frequency Dipstick negative.  Patient has increased use of soda, will discontinue for now.  Push fluids.  Monitor symptoms.  Return precautions given.  3.  Hypertension BP  systolic over 200s.  Asymptomatic.  Will restart HCTZ at this time.  Patient to continue blood pressure log, and to follow-up with PCP for further evaluation.  Return precautions given.  Final Clinical Impressions(s) / UC Diagnoses   Final diagnoses:  Pain, dental  Urinary frequency  Essential hypertension    ED Prescriptions    Medication Sig Dispense Auth. Provider   amoxicillin-clavulanate (AUGMENTIN) 875-125 MG tablet Take 1 tablet by mouth every 12 (twelve) hours. 14 tablet Samentha Perham V, PA-C   fluconazole (DIFLUCAN) 150 MG tablet Take 1 tablet (150 mg total) by mouth daily. Take second dose 72 hours later if symptoms still persists. 2 tablet Judeen Geralds V, PA-C   hydrochlorothiazide (HYDRODIURIL) 25 MG tablet Take 1 tablet (25 mg total) by mouth daily. 90 tablet Belinda Fisher, PA-C     PDMP not reviewed this encounter.   Belinda Fisher, PA-C 05/05/20 1324

## 2020-05-05 NOTE — Discharge Instructions (Addendum)
Dental pain Start Augmentin as directed for dental infection. Start diflucan as directed to cover for yeast. Follow up with dentist for further treatment and evaluation. If experiencing swelling of the throat, trouble breathing, trouble swallowing, leaning forward to breath, drooling, go to the emergency department for further evaluation.   Urinary frequency/urgency No signs of infection. Decrease soda intake and continue to monitor  Hypertension  Start HCTZ as directed, if needed, can take half tablet for first week then increase to full tablet. Keep blood pressure log. Follow up with PCP for further management needed. If having high blood pressure readings with headache/blurry vision, chest pain, shortness of breath, dizziness, weakness, go to the emergency department for further evaluation.

## 2020-05-05 NOTE — ED Notes (Signed)
Education provided for HTN monitoring and risks given.

## 2020-05-05 NOTE — ED Triage Notes (Signed)
Pt c/o urinary frequency, urgency for approx 1 week. Also c/o upper right tooth pain intermittently for approx 4 months, worse the past couple days. Denies fever, chills, n/v/d, abdominal/flank/back pain, HA, blurred vision, dizziness, CP. Pt denies any recent awareness of elevated BP. Had gestational HTN approx 16 years which resolved shortly after giving birth.

## 2021-07-20 ENCOUNTER — Ambulatory Visit
Admission: EM | Admit: 2021-07-20 | Discharge: 2021-07-20 | Disposition: A | Discharge status: Other Acute Inpatient Hospital | Payer: Self-pay | Attending: Internal Medicine | Admitting: Internal Medicine

## 2021-07-20 ENCOUNTER — Observation Stay (HOSPITAL_COMMUNITY)
Admission: EM | Admit: 2021-07-20 | Discharge: 2021-07-21 | Disposition: A | Discharge status: Home / Self Care | Payer: Self-pay | Attending: Internal Medicine | Admitting: Internal Medicine

## 2021-07-20 ENCOUNTER — Encounter: Payer: Self-pay | Admitting: Emergency Medicine

## 2021-07-20 ENCOUNTER — Emergency Department (HOSPITAL_COMMUNITY): Payer: Self-pay

## 2021-07-20 ENCOUNTER — Encounter (HOSPITAL_COMMUNITY): Payer: Self-pay | Admitting: Emergency Medicine

## 2021-07-20 ENCOUNTER — Other Ambulatory Visit: Payer: Self-pay

## 2021-07-20 DIAGNOSIS — R7989 Other specified abnormal findings of blood chemistry: Secondary | ICD-10-CM | POA: Diagnosis present

## 2021-07-20 DIAGNOSIS — D509 Iron deficiency anemia, unspecified: Secondary | ICD-10-CM

## 2021-07-20 DIAGNOSIS — Z79899 Other long term (current) drug therapy: Secondary | ICD-10-CM | POA: Insufficient documentation

## 2021-07-20 DIAGNOSIS — F17210 Nicotine dependence, cigarettes, uncomplicated: Secondary | ICD-10-CM | POA: Insufficient documentation

## 2021-07-20 DIAGNOSIS — D500 Iron deficiency anemia secondary to blood loss (chronic): Secondary | ICD-10-CM | POA: Diagnosis present

## 2021-07-20 DIAGNOSIS — Z91040 Latex allergy status: Secondary | ICD-10-CM | POA: Insufficient documentation

## 2021-07-20 DIAGNOSIS — I161 Hypertensive emergency: Secondary | ICD-10-CM | POA: Diagnosis present

## 2021-07-20 DIAGNOSIS — D649 Anemia, unspecified: Principal | ICD-10-CM | POA: Insufficient documentation

## 2021-07-20 DIAGNOSIS — Z720 Tobacco use: Secondary | ICD-10-CM | POA: Diagnosis present

## 2021-07-20 DIAGNOSIS — Z20822 Contact with and (suspected) exposure to covid-19: Secondary | ICD-10-CM | POA: Insufficient documentation

## 2021-07-20 DIAGNOSIS — I160 Hypertensive urgency: Secondary | ICD-10-CM | POA: Insufficient documentation

## 2021-07-20 DIAGNOSIS — N920 Excessive and frequent menstruation with regular cycle: Secondary | ICD-10-CM | POA: Diagnosis present

## 2021-07-20 DIAGNOSIS — R519 Headache, unspecified: Secondary | ICD-10-CM

## 2021-07-20 DIAGNOSIS — E876 Hypokalemia: Secondary | ICD-10-CM | POA: Diagnosis present

## 2021-07-20 DIAGNOSIS — R778 Other specified abnormalities of plasma proteins: Secondary | ICD-10-CM | POA: Diagnosis present

## 2021-07-20 DIAGNOSIS — I10 Essential (primary) hypertension: Secondary | ICD-10-CM | POA: Insufficient documentation

## 2021-07-20 LAB — CBC
HCT: 26.6 % — ABNORMAL LOW (ref 36.0–46.0)
Hemoglobin: 6.7 g/dL — CL (ref 12.0–15.0)
MCH: 12.8 pg — ABNORMAL LOW (ref 26.0–34.0)
MCHC: 25.2 g/dL — ABNORMAL LOW (ref 30.0–36.0)
MCV: 50.8 fL — ABNORMAL LOW (ref 80.0–100.0)
Platelets: 376 10*3/uL (ref 150–400)
RBC: 5.24 MIL/uL — ABNORMAL HIGH (ref 3.87–5.11)
RDW: 25.2 % — ABNORMAL HIGH (ref 11.5–15.5)
WBC: 7.5 10*3/uL (ref 4.0–10.5)
nRBC: 0.3 % — ABNORMAL HIGH (ref 0.0–0.2)

## 2021-07-20 LAB — URINALYSIS, ROUTINE W REFLEX MICROSCOPIC
Bilirubin Urine: NEGATIVE
Glucose, UA: NEGATIVE mg/dL
Hgb urine dipstick: NEGATIVE
Ketones, ur: NEGATIVE mg/dL
Leukocytes,Ua: NEGATIVE
Nitrite: NEGATIVE
Protein, ur: NEGATIVE mg/dL
Specific Gravity, Urine: 1.013 (ref 1.005–1.030)
pH: 7 (ref 5.0–8.0)

## 2021-07-20 LAB — BRAIN NATRIURETIC PEPTIDE: B Natriuretic Peptide: 96.1 pg/mL (ref 0.0–100.0)

## 2021-07-20 LAB — RESP PANEL BY RT-PCR (FLU A&B, COVID) ARPGX2
Influenza A by PCR: NEGATIVE
Influenza B by PCR: NEGATIVE
SARS Coronavirus 2 by RT PCR: NEGATIVE

## 2021-07-20 LAB — BASIC METABOLIC PANEL
Anion gap: 9 (ref 5–15)
BUN: 9 mg/dL (ref 6–20)
CO2: 25 mmol/L (ref 22–32)
Calcium: 8.7 mg/dL — ABNORMAL LOW (ref 8.9–10.3)
Chloride: 103 mmol/L (ref 98–111)
Creatinine, Ser: 0.72 mg/dL (ref 0.44–1.00)
GFR, Estimated: >60 mL/min (ref >60)
Glucose, Bld: 83 mg/dL (ref 70–99)
Potassium: 3.1 mmol/L — ABNORMAL LOW (ref 3.5–5.1)
Sodium: 137 mmol/L (ref 135–145)

## 2021-07-20 LAB — PREPARE RBC (CROSSMATCH)

## 2021-07-20 LAB — TROPONIN I (HIGH SENSITIVITY)
Troponin I (High Sensitivity): 81 ng/L — ABNORMAL HIGH (ref <18)
Troponin I (High Sensitivity): 88 ng/L — ABNORMAL HIGH (ref <18)

## 2021-07-20 LAB — PHOSPHORUS: Phosphorus: 2.6 mg/dL (ref 2.5–4.6)

## 2021-07-20 LAB — ABO/RH: ABO/RH(D): O POS

## 2021-07-20 LAB — PREGNANCY, URINE: Preg Test, Ur: NEGATIVE

## 2021-07-20 LAB — MAGNESIUM: Magnesium: 1.9 mg/dL (ref 1.7–2.4)

## 2021-07-20 MED ORDER — ONDANSETRON HCL 4 MG/2ML IJ SOLN: 4.0000 mg | Freq: Four times a day (QID) | INTRAMUSCULAR | Status: DC | PRN

## 2021-07-20 MED ORDER — AMLODIPINE BESYLATE 5 MG PO TABS
5.0000 mg | ORAL_TABLET | Freq: Every day | ORAL | Status: DC
  Administered 2021-07-20 – 2021-07-21 (×2): 5 mg via ORAL
  Filled 2021-07-20 (×2): qty 1

## 2021-07-20 MED ORDER — SODIUM CHLORIDE 0.9 % IV SOLN: 10.0000 mL/h | Freq: Once | INTRAVENOUS | Status: DC

## 2021-07-20 MED ORDER — HYDRALAZINE HCL 20 MG/ML IJ SOLN
10.0000 mg | Freq: Once | INTRAMUSCULAR | Status: AC
  Administered 2021-07-20: 10 mg via INTRAVENOUS
  Filled 2021-07-20: qty 1

## 2021-07-20 MED ORDER — ACETAMINOPHEN 650 MG RE SUPP: 650.0000 mg | Freq: Four times a day (QID) | RECTAL | Status: DC | PRN

## 2021-07-20 MED ORDER — ONDANSETRON HCL 4 MG PO TABS: 4.0000 mg | ORAL_TABLET | Freq: Four times a day (QID) | ORAL | Status: DC | PRN

## 2021-07-20 MED ORDER — LABETALOL HCL 5 MG/ML IV SOLN
10.0000 mg | INTRAVENOUS | Status: DC | PRN
  Filled 2021-07-20: qty 4

## 2021-07-20 MED ORDER — LABETALOL HCL 5 MG/ML IV SOLN
10.0000 mg | Freq: Once | INTRAVENOUS | Status: AC
  Administered 2021-07-20: 10 mg via INTRAVENOUS
  Filled 2021-07-20: qty 4

## 2021-07-20 MED ORDER — LABETALOL HCL 200 MG PO TABS
200.0000 mg | ORAL_TABLET | Freq: Two times a day (BID) | ORAL | Status: DC
  Administered 2021-07-20 – 2021-07-21 (×3): 200 mg via ORAL
  Filled 2021-07-20 (×3): qty 1

## 2021-07-20 MED ORDER — POTASSIUM CHLORIDE CRYS ER 20 MEQ PO TBCR
40.0000 meq | EXTENDED_RELEASE_TABLET | Freq: Once | ORAL | Status: AC
  Administered 2021-07-20: 40 meq via ORAL
  Filled 2021-07-20: qty 2

## 2021-07-20 MED ORDER — ACETAMINOPHEN 325 MG PO TABS: 650.0000 mg | ORAL_TABLET | Freq: Four times a day (QID) | ORAL | Status: DC | PRN

## 2021-07-20 MED ORDER — MAGNESIUM SULFATE 2 GM/50ML IV SOLN
2.0000 g | Freq: Once | INTRAVENOUS | Status: AC
  Administered 2021-07-20: 2 g via INTRAVENOUS
  Filled 2021-07-20: qty 50

## 2021-07-20 NOTE — ED Provider Notes (Signed)
2:48 PM Pt with h/o HTN, anemia, uncontrolled -- presents from UC with HA, HTN, crackles.  Here blood pressure as high as 230s systolic.  Patient has wheezing/crackles on exam.  No lower extremity swelling.  Was given dose of IV hydralazine with improvement in blood pressure, however short-lived.  On recheck systolic blood pressure in 210s.  Troponin mildly elevated at 81, pending 2nd marker, no active chest pain. Patient also found to have microcytic anemia.  She has received a blood transfusion in the past.  She denies significant orthopnea or lower extremity swelling.  Will request admission for hypertensive urgency with elevated troponin, symptomatic microcytic anemia.  ED ECG REPORT   Date: 07/20/2021  Rate: 68  Rhythm: normal sinus rhythm  QRS Axis: left  Intervals: normal  ST/T Wave abnormalities: nonspecific T wave changes  Conduction Disutrbances:none  Narrative Interpretation:   Old EKG Reviewed: changes noted new borderline t-waves  I have personally reviewed the EKG tracing and agree with the computerized printout as noted.   Discussed case with Dr. Robb Matar.    Renne Crigler, PA-C 07/20/21 1509    Rozelle Logan, DO 07/20/21 1522

## 2021-07-20 NOTE — ED Notes (Signed)
EMS on site

## 2021-07-20 NOTE — ED Provider Notes (Addendum)
[facility]    CSN: [id number] Arrival date & time: 07/20/21  0907      History   Chief Complaint Chief Complaint  Patient presents with   Cough   Hypertension    HPI [name] [name] is a 40 y.o. female.   Patient presents with "crackles in lungs" prominent when she lies flat that started approximately 2 days ago.  Patient denies any new cough, fever, body aches, nasal congestion, runny nose, sore throat, ear pain, nausea, vomiting, diarrhea, abdominal pain, shortness of breath, chest pain, headache, dizziness, blurred vision.  Patient also has an extremely elevated blood pressure today in urgent care.  Patient reports that she was prescribed hydrochlorothiazide approximately 6 months ago at urgent care due to an incidental finding with high blood pressure.  She had an appointment with PCP but was unable to attend for refill.  Patient has been out of hydrochlorothiazide for approximately 3 months.  Patient has never taken her blood pressure at home to evaluate.  Denies any other significant health problems.   Cough Hypertension   Past Medical History:  Diagnosis Date   Hypertension     There are no problems to display for this patient.   Past Surgical History:  Procedure Laterality Date   CESAREAN SECTION      OB History   No obstetric history on file.      Home Medications    Prior to Admission medications   Medication Sig Start Date End Date Taking? Authorizing Provider  amoxicillin-clavulanate (AUGMENTIN) 875-125 MG tablet Take 1 tablet by mouth every 12 (twelve) hours. 05/05/20   Cathie Hoops, Amy V, PA-C  benzocaine (ORAJEL) 10 % mucosal gel Use as directed 1 application in the mouth or throat as needed for mouth pain.    [provider]  diclofenac (VOLTAREN) 75 MG EC tablet Take 1 tablet (75 mg total) by mouth 2 (two) times daily. 09/02/18   Elson Areas, PA-C  ferrous sulfate (SLOW RELEASE IRON) 160 (50 Fe) MG TBCR SR tablet Take 1 tablet (160 mg  total) by mouth daily. 08/23/16   Tilden Fossa, MD  fluconazole (DIFLUCAN) 150 MG tablet Take 1 tablet (150 mg total) by mouth daily. Take second dose 72 hours later if symptoms still persists. 05/05/20   Cathie Hoops, Amy V, PA-C  hydrochlorothiazide (HYDRODIURIL) 25 MG tablet Take 1 tablet (25 mg total) by mouth daily. 05/05/20   Cathie Hoops, Amy V, PA-C  ondansetron (ZOFRAN-ODT) 8 MG disintegrating tablet Take 1 tablet (8 mg total) by mouth every 8 (eight) hours as needed for nausea. Patient not taking: Reported on 08/23/2016 11/27/14   Trena Platt D, PA  FLUoxetine (PROZAC) 10 MG tablet Take 10 mg by mouth daily.  05/05/20  [provider]  metoprolol succinate (TOPROL-XL) 50 MG 24 hr tablet Take 1 tablet (50 mg total) by mouth daily. Take with or immediately following a meal. 08/23/16 05/05/20  Tilden Fossa, MD    Family History Family History  Problem Relation Age of Onset   Hypertension Mother    Diabetes Mother    Diabetes Father     Social History Social History   Tobacco Use   Smoking status: Every Day    Packs/day: 0.10    Types: Cigarettes   Smokeless tobacco: Never  Substance Use Topics   Alcohol use: Yes    Comment: 3/week   Drug use: No     Allergies   Latex   Review of Systems Review of Systems Per HPI  Physical Exam Triage Vital Signs ED Triage Vitals [07/20/21 0954]  Enc Vitals Group     BP (!) 220/113     Pulse Rate 90     Resp 16     Temp 98.4 F (36.9 C)     Temp Source Oral     SpO2 98 %     Weight      Height      Head Circumference      Peak Flow      Pain Score 0     Pain Loc      Pain Edu?      Excl. in [place]?    No data found.  Updated Vital Signs BP (!) 220/123 (BP Location: Left Arm)   Pulse 90   Temp 98.4 F (36.9 C) (Oral)   Resp 16   SpO2 98%   Visual Acuity Right Eye Distance:   Left Eye Distance:   Bilateral Distance:    Right Eye Near:   Left Eye Near:    Bilateral Near:     Physical Exam Constitutional:       General: She is not in acute distress.    Appearance: Normal appearance. She is not toxic-appearing or diaphoretic.  HENT:     Head: Normocephalic and atraumatic.     Right Ear: Tympanic membrane and ear canal normal.     Left Ear: Tympanic membrane normal.     Nose: Nose normal.     Mouth/Throat:     Mouth: Mucous membranes are moist.     Pharynx: No posterior oropharyngeal erythema.  Eyes:     Extraocular Movements: Extraocular movements intact.     Conjunctiva/sclera: Conjunctivae normal.     Pupils: Pupils are equal, round, and reactive to light.  Cardiovascular:     Rate and Rhythm: Normal rate and regular rhythm.     Heart sounds: Murmur heard.  Pulmonary:     Effort: Pulmonary effort is normal. No respiratory distress.     Breath sounds: No stridor. Rhonchi present. No wheezing or rales.     Comments: Crackles in bilateral bases. Skin:    General: Skin is warm and dry.     Comments: No pedal edema noted.  Neurological:     General: No focal deficit present.     Mental Status: She is alert and oriented to person, place, and time. Mental status is at baseline.     Cranial Nerves: Cranial nerves 2-12 are intact.     Sensory: Sensation is intact.     Motor: Motor function is intact.     Coordination: Coordination is intact.     Gait: Gait is intact.  Psychiatric:        Mood and Affect: Mood normal.        Behavior: Behavior normal.        Thought Content: Thought content normal.        Judgment: Judgment normal.     UC Treatments / Results  Labs (all labs ordered are listed, but only abnormal results are displayed) Labs Reviewed - No data to display  EKG   Radiology No results found.  Procedures Procedures (including critical care time)  Medications Ordered in UC Medications - No data to display  Initial Impression / Assessment and Plan / UC Course  I have reviewed the triage vital signs and the nursing notes.  Pertinent labs & imaging results that were  available during my care of the patient were reviewed by me  and considered in my medical decision making (see chart for details).     Patient has significantly elevated blood pressure with murmur on auscultation as well as adventitious lung sounds.  Highly suspicious for more worrisome etiology.  Patient denies any history of murmur.  Advised patient that it would be beneficial for her to go to the hospital for further evaluation and management.  Patient was advised that it be best for her to go by ambulance due to blood pressure reading.  Patient was agreeable plan and left via EMS. Final Clinical Impressions(s) / UC Diagnoses   Final diagnoses:  Hypertensive emergency     Discharge Instructions      Patient left urgent care via EMS.    ED Prescriptions   None    PDMP not reviewed this encounter.   Teodora Medici, [place] 07/20/21 [place], [place], [place] 07/20/21 1039

## 2021-07-20 NOTE — ED Triage Notes (Signed)
C/o new crackles in lungs when she lays down at night, and notices it on waking. Denies cough, SOB, chest pain, N/V/D, fever, generalized body aches, headache. States she's been out of her HCTZ x 3 months and is requesting refill. BP 220/113 after being rechecked in triage. No pedal edema palpable in triage.

## 2021-07-20 NOTE — H&P (Signed)
History and Physical    [name] [name] [id number] DOB: 1981-04-24 DOA: 07/20/2021  PCP: Patient, No Pcp Per (Inactive)   Patient coming from: Home.   I have personally briefly reviewed patient's old medical records in [place]  Chief Complaint: Crackling sounds in her lungs.  HPI: [name] [name] is a 40 y.o. female with medical history significant of hypertension, menorrhagia, iron deficiency anemia, class I obesity, tobacco use who is coming to the emergency department with complaints of crackling sound that she noticed at night while lying down.  She also stated that her blood pressure has been high since she ran out of her HCTZ about 3 months time ago.  She denied chest pain, dyspnea, palpitations, productive cough or lower extremity edema.  No headache, blurred vision, palpitations or lightheadedness.  No abdominal pain, nausea, emesis, diarrhea, constipation, melena or hematochezia.  No flank pain, dysuria, frequency or hematuria.  She has had heavy menstrual periods for years and usually gets fatigued and somnolent when having them.  ED Course: Initial vital signs were temperature 98.9 F, pulse 66, respirations 16, BP 2 2410 mmHg O2 sat 100% on room air.  The patient received hydralazine 10 mg and labetalol 10 mg IVP both.  I added magnesium and potassium supplementation.  Lab work: Coronavirus and influenza PCR was negative.  Urinalysis was normal.  CBC showed a white count 7.5, hemoglobin 6.7 g/dL with an MCV of 50.8 fL and platelets of 376.  Troponin was 81 and then 88 ng/L.  BMP showed potassium of 3.1 mmol/L and a calcium level 8.7 mg/dL.  The rest of the electrolytes and renal function were normal.  Magnesium was 1.9 and phosphorus 2.6 mg/dL.  Imaging: Portable chest radiograph with no active disease.  CT head without contrast showed no acute intracranial normality.  Please see images and full daily report for further details.  Review of Systems: As per HPI otherwise all  other systems reviewed and are negative.  Past Medical History:  Diagnosis Date   Hypertension    Menorrhagia 06/16/2017   Past Surgical History:  Procedure Laterality Date   CESAREAN SECTION     Social History  reports that she has been smoking. She has been smoking an average of .1 packs per day. She has never used smokeless tobacco. She reports current alcohol use. She reports that she does not use drugs.  Allergies  Allergen Reactions   Latex    Family History  Problem Relation Age of Onset   Hypertension Mother    Diabetes Mother    Diabetes Father    Prior to Admission medications   Medication Sig Start Date End Date Taking? Authorizing Provider  ibuprofen (ADVIL) 200 MG tablet Take 200 mg by mouth every 6 (six) hours as needed.   Yes [provider]  amoxicillin-clavulanate (AUGMENTIN) 875-125 MG tablet Take 1 tablet by mouth every 12 (twelve) hours. Patient not taking: Reported on 07/20/2021 05/05/20   Ok Edwards, PA-C  benzocaine (ORAJEL) 10 % mucosal gel Use as directed 1 application in the mouth or throat as needed for mouth pain. Patient not taking: Reported on 07/20/2021    [provider]  diclofenac (VOLTAREN) 75 MG EC tablet Take 1 tablet (75 mg total) by mouth 2 (two) times daily. Patient not taking: Reported on 07/20/2021 09/02/18   Fransico Meadow, PA-C  ferrous sulfate (SLOW RELEASE IRON) 160 (50 Fe) MG TBCR SR tablet Take 1 tablet (160 mg total) by mouth daily. Patient not  taking: Reported on 07/20/2021 08/23/16   Tilden Fossa, MD  fluconazole (DIFLUCAN) 150 MG tablet Take 1 tablet (150 mg total) by mouth daily. Take second dose 72 hours later if symptoms still persists. Patient not taking: Reported on 07/20/2021 05/05/20   Belinda Fisher, PA-C  hydrochlorothiazide (HYDRODIURIL) 25 MG tablet Take 1 tablet (25 mg total) by mouth daily. Patient not taking: Reported on 07/20/2021 05/05/20   Belinda Fisher, PA-C  ondansetron (ZOFRAN-ODT) 8 MG disintegrating  tablet Take 1 tablet (8 mg total) by mouth every 8 (eight) hours as needed for nausea. Patient not taking: Reported on 08/23/2016 11/27/14   Trena Platt D, PA  FLUoxetine (PROZAC) 10 MG tablet Take 10 mg by mouth daily.  05/05/20  [provider]  metoprolol succinate (TOPROL-XL) 50 MG 24 hr tablet Take 1 tablet (50 mg total) by mouth daily. Take with or immediately following a meal. 08/23/16 05/05/20  Tilden Fossa, MD   Physical Exam: Vitals:   07/20/21 1342 07/20/21 1400 07/20/21 1430 07/20/21 1500  BP: (!) 212/87 (!) 172/89 (!) 217/97 (!) 195/83  Pulse: 73 72 77 86  Resp: 17 20 14  (!) 23  Temp:      TempSrc:      SpO2: 100% 100% 100% 100%  Weight:      Height:       Constitutional: NAD, calm, comfortable Eyes: PERRL, lids and conjunctivae are pale. ENMT: Mucous membranes are moist. Posterior pharynx clear of any exudate or lesions. Neck: normal, supple, no masses, no thyromegaly Respiratory: clear to auscultation bilaterally, no wheezing, no crackles. Normal respiratory effort. No accessory muscle use.  Cardiovascular: Regular rate and rhythm, 2/6 systolic murmur, no rubs / gallops. No extremity edema. 2+ pedal pulses. No carotid bruits.  Abdomen: No distention.  Bowel sounds positive.  Soft, no tenderness, no masses palpated. No hepatosplenomegaly.  Musculoskeletal: no clubbing / cyanosis. Good ROM, no contractures. Normal muscle tone.  Skin: no acute rashes, lesions, ulcers on limited examination. Neurologic: CN 2-12 grossly intact. Sensation intact, DTR normal. Strength 5/5 in all 4.  Psychiatric: Normal judgment and insight. Alert and oriented x 3. Normal mood.   Labs on Admission: I have personally reviewed following labs and imaging studies  CBC: Recent Labs  Lab 07/20/21 1318  WBC 7.5  HGB 6.7*  HCT 26.6*  MCV 50.8*  PLT 376   Basic Metabolic Panel: Recent Labs  Lab 07/20/21 1318  NA 137  K 3.1*  CL 103  CO2 25  GLUCOSE 83  BUN 9  CREATININE  0.72  CALCIUM 8.7*   GFR: Estimated Creatinine Clearance: 92.1 mL/min (by C-G formula based on SCr of 0.72 mg/dL).  Liver Function Tests: No results for input(s): AST, ALT, ALKPHOS, BILITOT, PROT, ALBUMIN in the last 168 hours.  Urine analysis:    Component Value Date/Time   COLORURINE YELLOW 07/20/2021 1312   APPEARANCEUR CLEAR 07/20/2021 1312   LABSPEC 1.013 07/20/2021 1312   PHURINE 7.0 07/20/2021 1312   GLUCOSEU NEGATIVE 07/20/2021 1312   HGBUR NEGATIVE 07/20/2021 1312   BILIRUBINUR NEGATIVE 07/20/2021 1312             KETONESUR NEGATIVE 07/20/2021 1312   PROTEINUR NEGATIVE 07/20/2021 1312        NITRITE NEGATIVE 07/20/2021 1312   LEUKOCYTESUR NEGATIVE 07/20/2021 1312   Radiological Exams on Admission: CT Head Wo Contrast  Result Date: 07/20/2021 CLINICAL DATA:  Headache, high blood pressure EXAM: CT HEAD WITHOUT CONTRAST TECHNIQUE: Contiguous axial images were obtained from  the base of the skull through the vertex without intravenous contrast. COMPARISON:  CT head 03/08/2014 FINDINGS: Brain: No evidence of acute infarction, hemorrhage, hydrocephalus, extra-axial collection or mass lesion/mass effect. Vascular: No hyperdense vessel or unexpected calcification. Skull: Normal. Negative for fracture or focal lesion. Sinuses/Orbits: Mucous retention cyst left maxillary sinus. Partial opacification of the left ethmoid sinuses. Mild right maxillary mucosal thickening. Other: None. IMPRESSION: 1. No acute intracranial pathology. 2. Sinus disease as above. Electronically Signed   By: Audie Pinto M.D.   On: 07/20/2021 14:43   DG Chest Port 1 View  Result Date: 07/20/2021 CLINICAL DATA:  Crackling in chest. EXAM: PORTABLE CHEST 1 VIEW COMPARISON:  August 23, 2016 FINDINGS: The heart size and mediastinal contours are within normal limits. Both lungs are clear. The visualized skeletal structures are unremarkable. IMPRESSION: No active disease. Electronically Signed   By: Dorise Bullion III M.D.   On: 07/20/2021 15:12    EKG: Independently reviewed.  Vent. rate 68 BPM PR interval 155 ms QRS duration 111 ms QT/QTcB 441/469 ms P-R-T axes -29 -55 66 Sinus rhythm Incomplete RBBB and LAFB Abnormal R-wave progression, early transition Probable left ventricular hypertrophy  Assessment/Plan Principal Problem:   Hypertensive emergency Observation/PCU. Resume amlodipine 5 mg p.o. daily. Begin labetalol 200 mg p.o. twice daily. Labetalol 10 mg IVP as needed for SBP > 179 mmHg. Monitor blood pressure and heart rate. Needs to establish with a primary care provider. Advised about high CVD risk of uncontrolled hypertension. Smoking cessation advised. Needs to improve diet and sleep.  Active Problems:   Iron deficiency anemia due to chronic blood loss Due to   Menorrhagia Transfuse 1 unit of PRBC. Begin iron supplementation. Follow-up H&H in AM. Follow-up with GYN as an outpatient.    Hypokalemia Replacing. Magnesium supplementation Follow-up level in AM.    Tobacco use Smoking cessation advised. Cardiovascular risk briefly discussed. Nicotine replacement therapy if needed.    Elevated troponin Repeat level slightly higher. Likely demand ischemia. Blood pressure control.    Hypocalcemia Recheck in a.m. with albumin level.    Class I obesity Needs lifestyle modifications. Follow-up with PCP.     DVT prophylaxis: SCDs. Code Status:   Full code. Family Communication:   Disposition Plan:   Patient is from:  Home.  Anticipated DC to:  Home.  Anticipated DC date:  07/21/2021 or 07/22/2021.  Anticipated DC barriers: Clinical status.  Consults called:   Admission status:  Observation/PCU.    Severity of Illness:High severity after presenting to the emergency department with complaints of "crackling in her lungs" incidentally found to be hypertensive with elevated troponin level.  The patient will be admitted for 24 to 48 hours for blood  pressure control and close cardio monitoring.  Reubin Milan MD [facility]  How to contact the [facility] Attending or Consulting provider [place] or covering provider during after hours [place], for this patient?   Check the care team in [facility] and look for a) attending/consulting [facility] provider listed and b) the [facility] team listed Log into [email or web address] and use [place]'s universal password to access. If you do not have the password, please contact the hospital operator. Locate the [facility] provider you are looking for under [facility] and page to a number that you can be directly reached. If you still have difficulty reaching the provider, please page the [facility] (Director on Call) for the Hospitalists listed on amion for assistance.  07/20/2021, 3:53 PM   This document  was prepared using Systems analyst and may contain some unintended transcription errors.

## 2021-07-20 NOTE — Discharge Instructions (Signed)
Patient left urgent care via EMS.

## 2021-07-20 NOTE — ED Notes (Signed)
[facility] 6.7, PA does not have a phone number, messaged and got a response.

## 2021-07-20 NOTE — ED Triage Notes (Signed)
Pt to ER via EMS from urgent care.  Pt presented to UC for "crackling" in her chest when she lies down at night.  Pt has hx of HTN, but has not been taking meds for last several months due to no PCP.  Pt denies CP, SHOB, states mild headache at this time.

## 2021-07-20 NOTE — ED Provider Notes (Signed)
This [facility] DEPT Provider Note   CSN: [id number] Arrival date & time: 07/20/21  1211     History Chief Complaint  Patient presents with   Hypertension   Headache    [name] [name] is [name] 40 y.o. female with [name] past medical history of hypertension who presents to the ED via EMS from urgent care complaining of posterior headache onset 5 days.  She reports her headache has been waxing and waning.  Her headache is 6/10 currently described as sharp pressure sensation.  Patient reports she was taking HCTZ however ran out of the prescription due to not having [name] primary care provider.  Patient was evaluated in urgent care today for "crackling" in her chest when she lays down at night.  She has associated cough x2 days, chest congestion, blurred vision.  She has tried advil which has made her headache tolerable.  She denies chest pain, shortness of breath, abdominal pain, nausea, vomiting, or vision change.  She denies sick contacts.   Patient is [name] current smoker and smokes 4 to 5 cigarettes/day for the past 6 years.  She has [name] previous history of iron deficiency anemia while pregnant with her daughter 18 years ago.  Patient had received iron transfusion at that time.  Patient has [name] history of heavy menstrual cycles.  She denies blood in the stool.  She reports 1 month ago she had an episode of hemoptysis that she was not evaluated for.  Per chart review: Patient was seen at urgent care this morning and referred to the ED due to elevated blood pressure and headache for further evaluation.   The history is provided by the patient. No language interpreter was used.  Hypertension Associated symptoms include headaches. Pertinent negatives include no chest pain, no abdominal pain and no shortness of breath.  Headache Pain location:  Occipital Quality:  Sharp (pressure) Radiates to: right temporal; Severity currently:  6/10 Severity at highest:  9/10 Similar to prior  headaches: no (normal ones in the front)   Context: caffeine   Context: not activity, not exposure to bright light, not intercourse and not loud noise   Associated symptoms: cough   Associated symptoms: no abdominal pain, no dizziness, no fever, no nausea, no numbness, no vomiting and no weakness       Past Medical History:  Diagnosis Date   Hypertension     Patient Active Problem List   Diagnosis Date Noted   Hypertensive emergency 07/20/2021     Past Surgical History:  Procedure Laterality Date   CESAREAN SECTION       OB History   No obstetric history on file.     Family History  Problem Relation Age of Onset   Hypertension Mother    Diabetes Mother    Diabetes Father     Social History   Tobacco Use   Smoking status: Every Day    Packs/day: 0.10    Types: Cigarettes   Smokeless tobacco: Never  Substance Use Topics   Alcohol use: Yes    Comment: 3/week   Drug use: No    Home Medications Prior to Admission medications   Medication Sig Start Date End Date Taking? Authorizing Provider  ibuprofen (ADVIL) 200 MG tablet Take 200 mg by mouth every 6 (six) hours as needed.   Yes [provider]  amoxicillin-clavulanate (AUGMENTIN) 875-125 MG tablet Take 1 tablet by mouth every 12 (twelve) hours. Patient not taking: Reported on 07/20/2021 05/05/20   Cathlean Sauer  V, PA-C  benzocaine (ORAJEL) 10 % mucosal gel Use as directed 1 application in the mouth or throat as needed for mouth pain. Patient not taking: Reported on 07/20/2021    [provider]  diclofenac (VOLTAREN) 75 MG EC tablet Take 1 tablet (75 mg total) by mouth 2 (two) times daily. Patient not taking: Reported on 07/20/2021 09/02/18   Fransico Meadow, PA-C  ferrous sulfate (SLOW RELEASE IRON) 160 (50 Fe) MG TBCR SR tablet Take 1 tablet (160 mg total) by mouth daily. Patient not taking: Reported on 07/20/2021 08/23/16   Quintella Reichert, MD  fluconazole (DIFLUCAN) 150 MG tablet Take 1 tablet (150  mg total) by mouth daily. Take second dose 72 hours later if symptoms still persists. Patient not taking: Reported on 07/20/2021 05/05/20   Ok Edwards, PA-C  hydrochlorothiazide (HYDRODIURIL) 25 MG tablet Take 1 tablet (25 mg total) by mouth daily. Patient not taking: Reported on 07/20/2021 05/05/20   Ok Edwards, PA-C  ondansetron (ZOFRAN-ODT) 8 MG disintegrating tablet Take 1 tablet (8 mg total) by mouth every 8 (eight) hours as needed for nausea. Patient not taking: Reported on 08/23/2016 11/27/14   Ivar Drape D, PA  FLUoxetine (PROZAC) 10 MG tablet Take 10 mg by mouth daily.  05/05/20  [provider]  metoprolol succinate (TOPROL-XL) 50 MG 24 hr tablet Take 1 tablet (50 mg total) by mouth daily. Take with or immediately following [name] meal. 08/23/16 05/05/20  Quintella Reichert, MD    Allergies    Latex  Review of Systems   Review of Systems  Constitutional:  Negative for chills and fever.  Eyes:  Negative for visual disturbance.  Respiratory:  Positive for cough. Negative for shortness of breath.   Cardiovascular:  Negative for chest pain and leg swelling.  Gastrointestinal:  Negative for abdominal pain, nausea and vomiting.  Skin:  Negative for color change and rash.  Neurological:  Positive for headaches. Negative for dizziness, weakness, light-headedness and numbness.  All other systems reviewed and are negative.  Physical Exam Updated Vital Signs BP (!) 224/110   Pulse 66   Temp 98.9 F (37.2 C) (Oral)   Resp 16   Ht 5\' 3"  (1.6 m)   Wt 80.7 kg   SpO2 100%   BMI 31.53 kg/m   Physical Exam Vitals and nursing note reviewed.  Constitutional:      General: She is not in acute distress.    Appearance: She is not diaphoretic.  HENT:     Head: Normocephalic and atraumatic.     Nose: Nose normal. No congestion or rhinorrhea.     Mouth/Throat:     Mouth: Mucous membranes are moist.     Pharynx: Oropharynx is clear. No oropharyngeal exudate or posterior oropharyngeal  erythema.  Eyes:     General: No scleral icterus.    Extraocular Movements: Extraocular movements intact.     Conjunctiva/sclera: Conjunctivae normal.  Neck:     Comments: Tenderness to palpation to cervical paraspinal region. Cardiovascular:     Rate and Rhythm: Normal rate and regular rhythm.     Pulses: Normal pulses.     Heart sounds: Normal heart sounds.  Pulmonary:     Effort: Pulmonary effort is normal. No respiratory distress.     Breath sounds: Rales present. No wheezing.     Comments: Crackles noted to bilateral lung bases. Chest:     Chest wall: Tenderness present. No deformity, swelling or crepitus.     Comments: Mild right-sided  chest wall tenderness.  No overlying erythema, ecchymosis, deformity. Abdominal:     General: Bowel sounds are normal.     Palpations: Abdomen is soft. There is no mass.     Tenderness: There is no abdominal tenderness. There is no guarding or rebound.  Musculoskeletal:        General: Normal range of motion.     Cervical back: Normal range of motion and neck supple.     Right lower leg: No edema.     Left lower leg: No edema.     Comments: Strength and sensation intact to bilateral upper and lower extremities.  DP and PT pulses intact bilaterally.  Skin:    General: Skin is warm and dry.     Capillary Refill: Capillary refill takes less than 2 seconds.  Neurological:     General: No focal deficit present.     Mental Status: She is alert.     Sensory: Sensation is intact.     Motor: Motor function is intact.     Coordination: Coordination is intact.  Psychiatric:        Behavior: Behavior normal.    ED Results / Procedures / Treatments   Labs (all labs ordered are listed, but only abnormal results are displayed) Labs Reviewed  BASIC METABOLIC PANEL - Abnormal; Notable for the following components:      Result Value   Potassium 3.1 (*)    Calcium 8.7 (*)    All other components within normal limits  CBC - Abnormal; Notable for the  following components:   RBC 5.24 (*)    Hemoglobin 6.7 (*)    HCT 26.6 (*)    MCV 50.8 (*)    MCH 12.8 (*)    MCHC 25.2 (*)    RDW 25.2 (*)    nRBC 0.3 (*)    All other components within normal limits  TROPONIN I (HIGH SENSITIVITY) - Abnormal; Notable for the following components:   Troponin I (High Sensitivity) 81 (*)    All other components within normal limits  RESP PANEL BY RT-PCR (FLU [name]&B, COVID) ARPGX2  URINALYSIS, ROUTINE W REFLEX MICROSCOPIC  PREGNANCY, URINE  BRAIN NATRIURETIC PEPTIDE  MAGNESIUM  PHOSPHORUS  TYPE AND SCREEN  PREPARE RBC (CROSSMATCH)  ABO/RH  TROPONIN I (HIGH SENSITIVITY)    EKG None  Radiology CT Head Wo Contrast  Result Date: 07/20/2021 CLINICAL DATA:  Headache, high blood pressure EXAM: CT HEAD WITHOUT CONTRAST TECHNIQUE: Contiguous axial images were obtained from the base of the skull through the vertex without intravenous contrast. COMPARISON:  CT head 03/08/2014 FINDINGS: Brain: No evidence of acute infarction, hemorrhage, hydrocephalus, extra-axial collection or mass lesion/mass effect. Vascular: No hyperdense vessel or unexpected calcification. Skull: Normal. Negative for fracture or focal lesion. Sinuses/Orbits: Mucous retention cyst left maxillary sinus. Partial opacification of the left ethmoid sinuses. Mild right maxillary mucosal thickening. Other: None. IMPRESSION: 1. No acute intracranial pathology. 2. Sinus disease as above. Electronically Signed   By: [name] [name] M.D.   On: 07/20/2021 14:43   DG Chest Port 1 View  Result Date: 07/20/2021 CLINICAL DATA:  Crackling in chest. EXAM: PORTABLE CHEST 1 VIEW COMPARISON:  August 23, 2016 FINDINGS: The heart size and mediastinal contours are within normal limits. Both lungs are clear. The visualized skeletal structures are unremarkable. IMPRESSION: No active disease. Electronically Signed   By: [name] [name] M.D.   On: 07/20/2021 15:12    Procedures .Critical Care Performed by:  [name] Colder A, PA-C  Authorized by: [name] Cai, PA-C   Critical care provider statement:    Critical care time (minutes):  30   Critical care was necessary to treat or prevent imminent or life-threatening deterioration of the following conditions:  Cardiac failure and respiratory failure   Critical care was time spent personally by me on the following activities:  Development of treatment plan with patient or surrogate, evaluation of patient's response to treatment, examination of patient, obtaining history from patient or surrogate, review of old charts, re-evaluation of patient's condition, ordering and review of radiographic studies, ordering and review of laboratory studies and ordering and performing treatments and interventions   I assumed direction of critical care for this patient from another provider in my specialty: yes     Care discussed with: admitting provider     Medications Ordered in ED Medications  0.9 %  sodium chloride infusion (0 mL/hr Intravenous Hold 07/20/21 1455)  magnesium sulfate IVPB 2 g 50 mL (has no administration in time range)  amLODipine (NORVASC) tablet 5 mg (has no administration in time range)  labetalol (NORMODYNE) injection 10 mg (has no administration in time range)  labetalol (NORMODYNE) tablet 200 mg (has no administration in time range)  hydrALAZINE (APRESOLINE) injection 10 mg (10 mg Intravenous Given 07/20/21 1323)  labetalol (NORMODYNE) injection 10 mg (10 mg Intravenous Given 07/20/21 1505)  potassium chloride SA (KLOR-CON) CR tablet 40 mEq (40 mEq Oral Given 07/20/21 1503)    ED Course  I have reviewed the triage vital signs and the nursing notes.  Pertinent labs & imaging results that were available during my care of the patient were reviewed by me and considered in my medical decision making (see chart for details).  Clinical Course as of 07/20/21 1519  Sat Jul 20, 2021  1434 Patient reevaluated.  Patient resting comfortably on  stretcher.  Updated patient regarding low hemoglobin level.  Discussed with patient likely admission to the hospital.  Patient agreeable at this time. [SB]  1503 [name] Bleacher, PA-C consult with hospitalist. Hospitalist, [name] [name] will evaluate in the ED.  [SB]    Clinical Course User Index [SB] [name] Burkland A, PA-C   MDM Rules/Calculators/[name]&P                          Pt presented to the ED with occipital headache x 5 days.  Patient was previously prescribed hydrochlorothiazide, she has been out of her HCTZ.  Denies chest pain, vision change, or urinary symptoms.  She was evaluated in urgent care today and transported to the ED via EMS due to elevated blood pressure.  Patient is [name] current smoker, smokes 4-5 cigarettes/day x 6 years. Initial blood pressure elevated at 220/110 on arrival. Pt given IV hydralazine. On exam, patient without LE edema, pt with crackles on exam, otherwise remainder of exam without acute findings. Differential diagnosis includes SAH, ICH, or hypertensive urgency. Pt is afebrile with no focal neuro deficits.  Initial blood pressure improved to 172/89 following IV hydralazine.  EKG with LVH noted and no acute ST/T changes. LVH noted on previous EKG. Urinalysis negative for infection.  Initial troponin elevated at 81, will obtain second troponin.  Initial troponin likely elevated due to increased demand.  BNP unremarkable. Hemoglobin decreased to 6.7, MCV decreased to 50.8, likely microcytic anemia. Potassium decreased to 3.1, repleted in ED.  Type and screen obtained, 1 unit PRBC ordered. CT head obtained and showed no acute intracranial bleed. CXR obtained  with results pending at admission. Recommend follow up inpatient. Patient will likely need echocardiogram. Presentation less likely due to [name] or ICH. IV labetalol ordered due to continued hypertension in the ED.  Discussed case with [name] Oh, PA-C who evaluated patient and agrees with treatment plan.  Concern for hypertensive  urgency with elevated troponin and symptomatic microcytic anemia. Consult placed to hospitalist, [name] [name] who will evaluate patient in the ED and admit. Patient will need further work-up in hospital.   Final Clinical Impression(s) / ED Diagnoses Final diagnoses:  Microcytic anemia  Acute nonintractable headache, unspecified headache type  Hypertensive urgency    Rx / DC Orders ED Discharge Orders     None        [name] [name] A, PA-C 07/20/21 1519    [name] [name], [name] Critchley, DO 07/20/21 1524

## 2021-07-21 LAB — TYPE AND SCREEN
ABO/RH(D): O POS
Antibody Screen: NEGATIVE
Unit division: 0

## 2021-07-21 LAB — BPAM RBC
Blood Product Expiration Date: 202212212359
ISSUE DATE / TIME: 202211191652
Unit Type and Rh: 5100

## 2021-07-21 LAB — COMPREHENSIVE METABOLIC PANEL
ALT: 16 U/L (ref 0–44)
AST: 14 U/L — ABNORMAL LOW (ref 15–41)
Albumin: 3.4 g/dL — ABNORMAL LOW (ref 3.5–5.0)
Alkaline Phosphatase: 61 U/L (ref 38–126)
Anion gap: 7 (ref 5–15)
BUN: 13 mg/dL (ref 6–20)
CO2: 23 mmol/L (ref 22–32)
Calcium: 8.2 mg/dL — ABNORMAL LOW (ref 8.9–10.3)
Chloride: 106 mmol/L (ref 98–111)
Creatinine, Ser: 0.78 mg/dL (ref 0.44–1.00)
GFR, Estimated: >60 mL/min (ref >60)
Glucose, Bld: 127 mg/dL — ABNORMAL HIGH (ref 70–99)
Potassium: 3 mmol/L — ABNORMAL LOW (ref 3.5–5.1)
Sodium: 136 mmol/L (ref 135–145)
Total Bilirubin: 0.5 mg/dL (ref 0.3–1.2)
Total Protein: 6.6 g/dL (ref 6.5–8.1)

## 2021-07-21 LAB — CBC
HCT: 26.9 % — ABNORMAL LOW (ref 36.0–46.0)
Hemoglobin: 7.1 g/dL — ABNORMAL LOW (ref 12.0–15.0)
MCH: 14.3 pg — ABNORMAL LOW (ref 26.0–34.0)
MCHC: 26.4 g/dL — ABNORMAL LOW (ref 30.0–36.0)
MCV: 54.3 fL — ABNORMAL LOW (ref 80.0–100.0)
Platelets: 345 10*3/uL (ref 150–400)
RBC: 4.95 MIL/uL (ref 3.87–5.11)
RDW: 29.2 % — ABNORMAL HIGH (ref 11.5–15.5)
WBC: 8.3 10*3/uL (ref 4.0–10.5)
nRBC: 0 % (ref 0.0–0.2)

## 2021-07-21 LAB — HIV ANTIBODY (ROUTINE TESTING W REFLEX): HIV Screen 4th Generation wRfx: NONREACTIVE

## 2021-07-21 MED ORDER — AMLODIPINE BESYLATE 5 MG PO TABS: 5.0000 mg | ORAL_TABLET | Freq: Every day | ORAL | 0 refills | Status: DC

## 2021-07-21 MED ORDER — LABETALOL HCL 200 MG PO TABS: 200.0000 mg | ORAL_TABLET | Freq: Two times a day (BID) | ORAL | 0 refills | Status: DC

## 2021-07-21 MED ORDER — POTASSIUM CHLORIDE CRYS ER 20 MEQ PO TBCR
40.0000 meq | EXTENDED_RELEASE_TABLET | ORAL | Status: DC
  Administered 2021-07-21: 40 meq via ORAL
  Filled 2021-07-21: qty 2

## 2021-07-21 NOTE — Discharge Summary (Signed)
Physician Discharge Summary  [name] [id number] DOB: Jan 17, 1981 DOA: 07/20/2021  PCP: Patient, No Pcp Per (Inactive)  Admit date: 07/20/2021 Discharge date: 07/21/2021  Admitted From: Home Disposition: Home  Recommendations for Outpatient Follow-up:  Follow up with PCP in 1-2 weeks Please obtain BMP/CBC in one week Please follow up with OB/GYN as scheduled  Home Health: None Equipment/Devices: None  Discharge Condition: Stable CODE STATUS: Full  Diet recommendation: As tolerated  Brief/Interim Summary: [name] [name] is a 40 y.o. female with medical history significant of hypertension, menorrhagia, iron deficiency anemia, class I obesity, tobacco use who is coming to the emergency department with complaints of crackling sound that she noticed at night while lying down.  She also stated that her blood pressure has been high since she ran out of her HCTZ about 3 months time ago.  She denied chest pain, dyspnea, palpitations, productive cough or lower extremity edema.  No headache, blurred vision, palpitations or lightheadedness.  No abdominal pain, nausea, emesis, diarrhea, constipation, melena or hematochezia.  No flank pain, dysuria, frequency or hematuria.  She has had heavy menstrual periods for years and usually gets fatigued and somnolent when having them.   ED Course: Initial vital signs were temperature 98.9 F, pulse 66, respirations 16, BP 2 2410 mmHg O2 sat 100% on room air.  The patient received hydralazine 10 mg and labetalol 10 mg IVP both.  I added magnesium and potassium supplementation.   Assessment & Plan:   Hypertensive emergency, resolved Elevated troponin, resolving Continue on amlodipine and labetalol, blood pressure markedly more well controlled, symptoms resolving  Lengthy discussion about need for lifestyle and medication compliance as well as discontinuation of nicotine Need for close follow-up in the outpatient setting, has new PCP she is enrolled with,  recommend follow-up in the next 1 to 2 weeks   Acute on chronic symptomatic anemia Iron deficiency anemia due to chronic blood loss secondary to menorrhagia Hemoglobin 7.1, not currently bleeding, discussed increasing p.o. iron intake, resumed home iron supplementation  Follow-up with GYN as an outpatient.   Hypokalemia in the setting of poor p.o. intake, discussed appropriate diet at bedside with family   Tobacco use Smoking cessation advised. Cardiovascular risk discussed at length at bedside with patient and family   Hypocalcemia In the setting of poor p.o. intake, discussed appropriate diet at bedside   Class I obesity Needs lifestyle modifications. Follow-up with PCP as scheduled.   Discharge Instructions   Allergies as of 07/21/2021       Reactions   Latex         Medication List     STOP taking these medications    amoxicillin-clavulanate 875-125 MG tablet Commonly known as: AUGMENTIN   benzocaine 10 % mucosal gel Commonly known as: ORAJEL   diclofenac 75 MG EC tablet Commonly known as: VOLTAREN   fluconazole 150 MG tablet Commonly known as: Diflucan   hydrochlorothiazide 25 MG tablet Commonly known as: HYDRODIURIL   ibuprofen 200 MG tablet Commonly known as: ADVIL   ondansetron 8 MG disintegrating tablet Commonly known as: ZOFRAN-ODT       TAKE these medications    amLODipine 5 MG tablet Commonly known as: NORVASC Take 1 tablet (5 mg total) by mouth daily. Start taking on: July 22, 2021   ferrous sulfate 160 (50 Fe) MG Tbcr SR tablet Commonly known as: Slow Release Iron Take 1 tablet (160 mg total) by mouth daily.   labetalol 200 MG tablet Commonly known as: NORMODYNE Take 1  tablet (200 mg total) by mouth 2 (two) times daily.        Allergies  Allergen Reactions   Latex     Consultations: None  Procedures/Studies: CT Head Wo Contrast  Result Date: 07/20/2021 CLINICAL DATA:  Headache, high blood pressure EXAM: CT  HEAD WITHOUT CONTRAST TECHNIQUE: Contiguous axial images were obtained from the base of the skull through the vertex without intravenous contrast. COMPARISON:  CT head 03/08/2014 FINDINGS: Brain: No evidence of acute infarction, hemorrhage, hydrocephalus, extra-axial collection or mass lesion/mass effect. Vascular: No hyperdense vessel or unexpected calcification. Skull: Normal. Negative for fracture or focal lesion. Sinuses/Orbits: Mucous retention cyst left maxillary sinus. Partial opacification of the left ethmoid sinuses. Mild right maxillary mucosal thickening. Other: None. IMPRESSION: 1. No acute intracranial pathology. 2. Sinus disease as above. Electronically Signed   By: Audie Pinto M.D.   On: 07/20/2021 14:43   DG Chest Port 1 View  Result Date: 07/20/2021 CLINICAL DATA:  Crackling in chest. EXAM: PORTABLE CHEST 1 VIEW COMPARISON:  August 23, 2016 FINDINGS: The heart size and mediastinal contours are within normal limits. Both lungs are clear. The visualized skeletal structures are unremarkable. IMPRESSION: No active disease. Electronically Signed   By: Dorise Bullion III M.D.   On: 07/20/2021 15:12     Subjective: No acute issues or events overnight   Discharge Exam: Vitals:   07/21/21 0200 07/21/21 0317  BP: 125/66 (!) 157/87  Pulse: 63 60  Resp: 16 20  Temp:  98.6 F (37 C)  SpO2: 100% 99%   Vitals:   07/20/21 2130 07/21/21 0030 07/21/21 0200 07/21/21 0317  BP: 129/69 133/71 125/66 (!) 157/87  Pulse: 79 70 63 60  Resp: 16 15 16 20   Temp:    98.6 F (37 C)  TempSrc:    Oral  SpO2: 100% 100% 100% 99%  Weight:      Height:        General: Pt is alert, awake, not in acute distress Cardiovascular: RRR, S1/S2 +, no rubs, no gallops Respiratory: CTA bilaterally, no wheezing, no rhonchi Abdominal: Soft, NT, ND, bowel sounds + Extremities: no edema, no cyanosis    The results of significant diagnostics from this hospitalization (including imaging, microbiology,  ancillary and laboratory) are listed below for reference.     Microbiology: Recent Results (from the past 240 hour(s))  Resp Panel by RT-PCR (Flu A&B, Covid) Nasopharyngeal Swab     Status: None   Collection Time: 07/20/21  2:41 PM   Specimen: Nasopharyngeal Swab; Nasopharyngeal(NP) swabs in vial transport medium  Result Value Ref Range Status   SARS Coronavirus 2 by RT PCR NEGATIVE NEGATIVE Final    Comment: (NOTE) SARS-CoV-2 target nucleic acids are NOT DETECTED.  The SARS-CoV-2 RNA is generally detectable in upper respiratory specimens during the acute phase of infection. The lowest concentration of SARS-CoV-2 viral copies this assay can detect is 138 copies/mL. A negative result does not preclude SARS-Cov-2 infection and should not be used as the sole basis for treatment or other patient management decisions. A negative result may occur with  improper specimen collection/handling, submission of specimen other than nasopharyngeal swab, presence of viral mutation(s) within the areas targeted by this assay, and inadequate number of viral copies(<138 copies/mL). A negative result must be combined with clinical observations, patient history, and epidemiological information. The expected result is Negative.  Fact Sheet for Patients:  EntrepreneurPulse.com.au  Fact Sheet for Healthcare Providers:  IncredibleEmployment.be  This test is no t yet approved  or cleared by the [place] and  has been authorized for detection and/or diagnosis of SARS-CoV-2 by FDA under an Emergency Use Authorization (EUA). This EUA will remain  in effect (meaning this test can be used) for the duration of the COVID-19 declaration under Section 564(b)(1) of the Act, 21 U.S.C.section 360bbb-3(b)(1), unless the authorization is terminated  or revoked sooner.       Influenza A by PCR NEGATIVE NEGATIVE Final   Influenza B by PCR NEGATIVE NEGATIVE Final     Comment: (NOTE) The Xpert Xpress SARS-CoV-2/FLU/RSV plus assay is intended as an aid in the diagnosis of influenza from Nasopharyngeal swab specimens and should not be used as a sole basis for treatment. Nasal washings and aspirates are unacceptable for Xpert Xpress SARS-CoV-2/FLU/RSV testing.  Fact Sheet for Patients: EntrepreneurPulse.com.au  Fact Sheet for Healthcare Providers: IncredibleEmployment.be  This test is not yet approved or cleared by the [place] FDA and has been authorized for detection and/or diagnosis of SARS-CoV-2 by FDA under an Emergency Use Authorization (EUA). This EUA will remain in effect (meaning this test can be used) for the duration of the COVID-19 declaration under Section 564(b)(1) of the Act, 21 U.S.C. section 360bbb-3(b)(1), unless the authorization is terminated or revoked.  Performed at [facility], [place] [address] [place], [place] [address]      Labs: BNP (last 3 results) Recent Labs    07/20/21 1425  BNP [id number]   Basic Metabolic Panel: Recent Labs  Lab 07/20/21 1318 07/20/21 1501 07/21/21 0358  NA 137  --  136  K 3.1*  --  3.0*  CL 103  --  106  CO2 25  --  23  GLUCOSE 83  --  127*  BUN 9  --  13  CREATININE 0.72  --  0.78  CALCIUM 8.7*  --  8.2*  MG  --  1.9  --   PHOS  --  2.6  --    Liver Function Tests: Recent Labs  Lab 07/21/21 0358  AST 14*  ALT 16  ALKPHOS 61  BILITOT 0.5  PROT 6.6  ALBUMIN 3.4*   No results for input(s): LIPASE, AMYLASE in the last 168 hours. No results for input(s): AMMONIA in the last 168 hours. CBC: Recent Labs  Lab 07/20/21 1318 07/21/21 0358  WBC 7.5 8.3  HGB 6.7* 7.1*  HCT 26.6* 26.9*  MCV 50.8* 54.3*  PLT 376 345   Cardiac Enzymes: No results for input(s): CKTOTAL, CKMB, CKMBINDEX, TROPONINI in the last 168 hours. BNP: Invalid input(s): POCBNP CBG: No results for input(s): GLUCAP in the last 168  hours. D-Dimer No results for input(s): DDIMER in the last 72 hours. Hgb A1c No results for input(s): HGBA1C in the last 72 hours. Lipid Profile No results for input(s): CHOL, HDL, LDLCALC, TRIG, CHOLHDL, LDLDIRECT in the last 72 hours. Thyroid function studies No results for input(s): TSH, T4TOTAL, T3FREE, THYROIDAB in the last 72 hours.  Invalid input(s): FREET3 Anemia work up No results for input(s): VITAMINB12, FOLATE, FERRITIN, TIBC, IRON, RETICCTPCT in the last 72 hours. Urinalysis    Component Value Date/Time   COLORURINE YELLOW 07/20/2021 1312   APPEARANCEUR CLEAR 07/20/2021 1312   LABSPEC 1.013 07/20/2021 1312   PHURINE 7.0 07/20/2021 1312   [place] 07/20/2021 1312   HGBUR NEGATIVE 07/20/2021 1312   BILIRUBINUR NEGATIVE 07/20/2021 1312   BILIRUBINUR negative 05/05/2020 1106   BILIRUBINUR small 11/27/2014 2050   [place] 07/20/2021 1312   PROTEINUR  NEGATIVE 07/20/2021 1312   UROBILINOGEN 0.2 05/05/2020 1106   NITRITE NEGATIVE 07/20/2021 1312   LEUKOCYTESUR NEGATIVE 07/20/2021 1312   Sepsis Labs Invalid input(s): PROCALCITONIN,  WBC,  LACTICIDVEN Microbiology Recent Results (from the past 240 hour(s))  Resp Panel by RT-PCR (Flu A&B, Covid) Nasopharyngeal Swab     Status: None   Collection Time: 07/20/21  2:41 PM   Specimen: Nasopharyngeal Swab; Nasopharyngeal(NP) swabs in vial transport medium  Result Value Ref Range Status   SARS Coronavirus 2 by RT PCR NEGATIVE NEGATIVE Final    Comment: (NOTE) SARS-CoV-2 target nucleic acids are NOT DETECTED.  The SARS-CoV-2 RNA is generally detectable in upper respiratory specimens during the acute phase of infection. The lowest concentration of SARS-CoV-2 viral copies this assay can detect is 138 copies/mL. A negative result does not preclude SARS-Cov-2 infection and should not be used as the sole basis for treatment or other patient management decisions. A negative result may occur with  improper  specimen collection/handling, submission of specimen other than nasopharyngeal swab, presence of viral mutation(s) within the areas targeted by this assay, and inadequate number of viral copies(<138 copies/mL). A negative result must be combined with clinical observations, patient history, and epidemiological information. The expected result is Negative.  Fact Sheet for Patients:  BloggerCourse.com  Fact Sheet for Healthcare Providers:  SeriousBroker.it  This test is no t yet approved or cleared by the [place] FDA and  has been authorized for detection and/or diagnosis of SARS-CoV-2 by FDA under an Emergency Use Authorization (EUA). This EUA will remain  in effect (meaning this test can be used) for the duration of the COVID-19 declaration under Section 564(b)(1) of the Act, 21 U.S.C.section 360bbb-3(b)(1), unless the authorization is terminated  or revoked sooner.       Influenza A by PCR NEGATIVE NEGATIVE Final   Influenza B by PCR NEGATIVE NEGATIVE Final    Comment: (NOTE) The Xpert Xpress SARS-CoV-2/FLU/RSV plus assay is intended as an aid in the diagnosis of influenza from Nasopharyngeal swab specimens and should not be used as a sole basis for treatment. Nasal washings and aspirates are unacceptable for Xpert Xpress SARS-CoV-2/FLU/RSV testing.  Fact Sheet for Patients: BloggerCourse.com  Fact Sheet for Healthcare Providers: SeriousBroker.it  This test is not yet approved or cleared by the [place] FDA and has been authorized for detection and/or diagnosis of SARS-CoV-2 by FDA under an Emergency Use Authorization (EUA). This EUA will remain in effect (meaning this test can be used) for the duration of the COVID-19 declaration under Section 564(b)(1) of the Act, 21 U.S.C. section 360bbb-3(b)(1), unless the authorization is terminated or revoked.  Performed at  [facility], [address], [place], [place] [address]      Time coordinating discharge: Over 30 minutes  SIGNED:   Azucena Fallen, DO [facility] 07/21/2021, 1:02 PM Pager   If 7PM-7AM, please contact night-coverage [email or web address]

## 2021-09-12 ENCOUNTER — Ambulatory Visit (INDEPENDENT_AMBULATORY_CARE_PROVIDER_SITE_OTHER): Payer: Self-pay | Admitting: Family Medicine

## 2021-09-12 ENCOUNTER — Encounter: Payer: Self-pay | Admitting: Family Medicine

## 2021-09-12 ENCOUNTER — Other Ambulatory Visit: Payer: Self-pay

## 2021-09-12 VITALS — BP 170/100 | HR 83 | Temp 98.1°F | Resp 16 | Ht 63.0 in | Wt 164.8 lb

## 2021-09-12 DIAGNOSIS — F17210 Nicotine dependence, cigarettes, uncomplicated: Secondary | ICD-10-CM

## 2021-09-12 DIAGNOSIS — K0889 Other specified disorders of teeth and supporting structures: Secondary | ICD-10-CM

## 2021-09-12 DIAGNOSIS — I10 Essential (primary) hypertension: Secondary | ICD-10-CM

## 2021-09-12 DIAGNOSIS — F17200 Nicotine dependence, unspecified, uncomplicated: Secondary | ICD-10-CM

## 2021-09-12 DIAGNOSIS — Z7689 Persons encountering health services in other specified circumstances: Secondary | ICD-10-CM

## 2021-09-12 DIAGNOSIS — D649 Anemia, unspecified: Secondary | ICD-10-CM

## 2021-09-12 DIAGNOSIS — N920 Excessive and frequent menstruation with regular cycle: Secondary | ICD-10-CM

## 2021-09-12 MED ORDER — LISINOPRIL 20 MG PO TABS: 20.0000 mg | ORAL_TABLET | Freq: Every day | ORAL | 0 refills | Status: DC

## 2021-09-12 MED ORDER — AMOXICILLIN 875 MG PO TABS: 875.0000 mg | ORAL_TABLET | Freq: Two times a day (BID) | ORAL | 0 refills | Status: AC

## 2021-09-12 MED ORDER — FLUCONAZOLE 150 MG PO TABS: 150.0000 mg | ORAL_TABLET | Freq: Once | ORAL | 0 refills | Status: AC

## 2021-09-12 NOTE — Progress Notes (Signed)
New Patient Office Visit  Subjective:  Patient ID: [name] [name], female    DOB: 1981/08/22  Age: 41 y.o. MRN: [id number]  CC:  Chief Complaint  Patient presents with   Follow-up    HPI [name] [name] presents for to establish care. Patient was admitted several weeks ago 2/2 hypertensive emergency. Patient reports that she has not been consistent with meds and has actually run out of some. She also reports that she was transfused 2/2 anemia and that she has continued to have very heavy menses.   Past Medical History:  Diagnosis Date   Hypertension    Menorrhagia 06/16/2017    Past Surgical History:  Procedure Laterality Date   CESAREAN SECTION      Family History  Problem Relation Age of Onset   Hypertension Mother    Diabetes Mother    Diabetes Father     Social History   Socioeconomic History   Marital status: Married    Spouse name: Not on file   Number of children: Not on file   Years of education: Not on file   Highest education level: Not on file  Occupational History   Not on file  Tobacco Use   Smoking status: Every Day    Packs/day: 0.10    Types: Cigarettes   Smokeless tobacco: Never  Substance and Sexual Activity   Alcohol use: Yes    Comment: 3/week   Drug use: No   Sexual activity: Never  Other Topics Concern   Not on file  Social History Narrative   Not on file   Social Determinants of Health   Financial Resource Strain: Not on file  Food Insecurity: Not on file  Transportation Needs: Not on file  Physical Activity: Not on file  Stress: Not on file  Social Connections: Not on file  Intimate Partner Violence: Not on file    ROS Review of Systems  HENT:  Positive for dental problem.   Genitourinary:  Positive for menstrual problem. Negative for dysuria and vaginal discharge.  All other systems reviewed and are negative.  Objective:   Today's Vitals: BP (!) 170/100    Pulse 83    Temp 98.1 F (36.7 C) (Oral)    Resp 16    Ht 5\' 3"   (1.6 m)    Wt 164 lb 12.8 oz (74.8 kg)    SpO2 96%    BMI 29.19 kg/m   Physical Exam Vitals and nursing note reviewed.  Constitutional:      General: She is not in acute distress. HENT:     Mouth/Throat:     Dentition: Dental caries and dental abscesses present.  Cardiovascular:     Rate and Rhythm: Normal rate and regular rhythm.  Pulmonary:     Effort: Pulmonary effort is normal.     Breath sounds: Normal breath sounds.  Abdominal:     Palpations: Abdomen is soft.     Tenderness: There is no abdominal tenderness.  Musculoskeletal:     Right lower leg: No edema.     Left lower leg: No edema.  Neurological:     General: No focal deficit present.     Mental Status: She is alert and oriented to person, place, and time.    Assessment & Plan:   1. Uncontrolled hypertension Elevated readings. Discussed compliance. Lisinopril  20 mg daily prescribed as adjunct to amlodipine. Will monitor  2. Anemia, unspecified type Monitoring labs ordered. Most likely 2/2 below. Patient to start otc iron  supplements. Will monitor - CBC with Differential  3. Menorrhagia with regular cycle Awaiting lab results - consider referral to gyn pending results.   4. Tooth pain Amoxicillin and diflucan prescribed. Patient to follow up with dental professional for further eval/mgt  5. Smoking Discussed cessation/reduction at length  6. Encounter to establish care     Outpatient Encounter Medications as of 09/12/2021  Medication Sig   amLODipine (NORVASC) 5 MG tablet Take 1 tablet (5 mg total) by mouth daily.   ferrous sulfate (SLOW RELEASE IRON) 160 (50 Fe) MG TBCR SR tablet Take 1 tablet (160 mg total) by mouth daily. (Patient not taking: Reported on 07/20/2021)   labetalol (NORMODYNE) 200 MG tablet Take 1 tablet (200 mg total) by mouth 2 (two) times daily.   [DISCONTINUED] FLUoxetine (PROZAC) 10 MG tablet Take 10 mg by mouth daily.   [DISCONTINUED] metoprolol succinate (TOPROL-XL) 50 MG 24 hr  tablet Take 1 tablet (50 mg total) by mouth daily. Take with or immediately following a meal.   No facility-administered encounter medications on file as of 09/12/2021.    Follow-up: No follow-ups on file.   Tommie Raymond, MD

## 2021-09-13 ENCOUNTER — Encounter: Payer: Self-pay | Admitting: Family Medicine

## 2021-09-13 LAB — CBC WITH DIFFERENTIAL/PLATELET
Basophils Absolute: 0 10*3/uL (ref 0.0–0.2)
Basos: 0 %
EOS (ABSOLUTE): 0.1 10*3/uL (ref 0.0–0.4)
Eos: 1 %
Hematocrit: 31.5 % — ABNORMAL LOW (ref 34.0–46.6)
Hemoglobin: 8.7 g/dL — ABNORMAL LOW (ref 11.1–15.9)
Immature Grans (Abs): 0 10*3/uL (ref 0.0–0.1)
Immature Granulocytes: 0 %
Lymphocytes Absolute: 2 10*3/uL (ref 0.7–3.1)
Lymphs: 18 %
MCH: 15.8 pg — ABNORMAL LOW (ref 26.6–33.0)
MCHC: 27.6 g/dL — ABNORMAL LOW (ref 31.5–35.7)
MCV: 57 fL — ABNORMAL LOW (ref 79–97)
Monocytes Absolute: 0.7 10*3/uL (ref 0.1–0.9)
Monocytes: 6 %
Neutrophils Absolute: 8 10*3/uL — ABNORMAL HIGH (ref 1.4–7.0)
Neutrophils: 75 %
Platelets: 465 10*3/uL — ABNORMAL HIGH (ref 150–450)
RBC: 5.52 x10E6/uL — ABNORMAL HIGH (ref 3.77–5.28)
RDW: 25.5 % — ABNORMAL HIGH (ref 11.7–15.4)
WBC: 10.8 10*3/uL (ref 3.4–10.8)

## 2021-09-23 DIAGNOSIS — D649 Anemia, unspecified: Secondary | ICD-10-CM | POA: Insufficient documentation

## 2021-11-18 ENCOUNTER — Other Ambulatory Visit: Payer: Self-pay | Admitting: Family Medicine

## 2021-11-18 MED ORDER — LISINOPRIL 20 MG PO TABS: 20.0000 mg | ORAL_TABLET | Freq: Every day | ORAL | 0 refills | Status: DC

## 2021-11-21 ENCOUNTER — Other Ambulatory Visit: Payer: Self-pay

## 2021-11-21 ENCOUNTER — Encounter: Payer: Self-pay | Admitting: *Deleted

## 2021-11-21 ENCOUNTER — Encounter: Payer: Self-pay | Admitting: Family Medicine

## 2021-11-21 ENCOUNTER — Ambulatory Visit: Payer: Self-pay

## 2021-11-21 ENCOUNTER — Ambulatory Visit (INDEPENDENT_AMBULATORY_CARE_PROVIDER_SITE_OTHER): Payer: Self-pay | Admitting: Family Medicine

## 2021-11-21 VITALS — BP 201/116 | HR 97 | Temp 98.0°F | Resp 16 | Wt 168.0 lb

## 2021-11-21 DIAGNOSIS — Z5321 Procedure and treatment not carried out due to patient leaving prior to being seen by health care provider: Secondary | ICD-10-CM

## 2021-11-21 DIAGNOSIS — I10 Essential (primary) hypertension: Secondary | ICD-10-CM | POA: Insufficient documentation

## 2021-11-21 MED ORDER — SPIRONOLACTONE 25 MG PO TABS: 25.0000 mg | ORAL_TABLET | Freq: Every day | ORAL | 1 refills | Status: DC

## 2021-11-21 NOTE — Progress Notes (Signed)
Patient is here for f/u HTN. Patient BP is still elevated. Provider is aware. Patient left without talking with provider. Patient said she had to pick up her children from school. ?

## 2021-11-21 NOTE — Progress Notes (Signed)
? ?Established Patient Office Visit ? ?Subjective:  ?Patient ID: Anina Schnake, female    DOB: June 02, 1981  Age: 41 y.o. MRN: [id number] ? ?CC:  ?Chief Complaint  ?Patient presents with  ?? Follow-up  ?? Hypertension  ? ? ?HPI ?Darlys Gales presents for evaluation of hypertension. Patient did leave after triage by nurse but before evaluation by physician.  ? ?Past Medical History:  ?Diagnosis Date  ?? Hypertension   ?? Menorrhagia 06/16/2017  ? ? ?Past Surgical History:  ?Procedure Laterality Date  ?? CESAREAN SECTION    ? ? ?Family History  ?Problem Relation Age of Onset  ?? Hypertension Mother   ?? Diabetes Mother   ?? Diabetes Father   ? ? ?Social History  ? ?Socioeconomic History  ?? Marital status: Single  ?  Spouse name: Not on file  ?? Number of children: Not on file  ?? Years of education: Not on file  ?? Highest education level: Not on file  ?Occupational History  ?? Not on file  ?Tobacco Use  ?? Smoking status: Every Day  ?  Packs/day: 0.10  ?  Types: Cigarettes  ?? Smokeless tobacco: Never  ?Substance and Sexual Activity  ?? Alcohol use: Yes  ?  Comment: 3/week  ?? Drug use: No  ?? Sexual activity: Never  ?Other Topics Concern  ?? Not on file  ?Social History Narrative  ?? Not on file  ? ?Social Determinants of Health  ? ?Financial Resource Strain: Not on file  ?Food Insecurity: Not on file  ?Transportation Needs: Not on file  ?Physical Activity: Not on file  ?Stress: Not on file  ?Social Connections: Not on file  ?Intimate Partner Violence: Not on file  ? ? ?ROS ?Review of Systems  ?All other systems reviewed and are negative. ? ?Objective:  ? ?Today's Vitals: BP (!) 201/116   Pulse 97   Temp 98 ?F (36.7 ?C) (Oral)   Resp 16   Wt 168 lb (76.2 kg)   SpO2 98%   BMI 29.76 kg/m?  ? ?Physical Exam ?Patient left before examination by physician ? ?Assessment & Plan:  ? ?1. Uncontrolled hypertension ?Patient with extremely elevated blood pressure. Compliance is most likely a part of this issue as patient  reported to the nurse that she is only taking the amlodipine 5 mg and not the lisinopril or labetolol. Nurse will follow up with recommendation for compliance of present meds with with increase of lisinopril from 20 mg to 40 mg and addition of spironolactone. In addition, if she has any chest or head symptoms to report to ED or 911 immediately. Lastly I will be referring the patient to cardiology for further eval/mgt/  ? ?2. Patient left before evaluation by physician ? ? ? ? ?Outpatient Encounter Medications as of 11/21/2021  ?Medication Sig  ?? amLODipine (NORVASC) 5 MG tablet Take 1 tablet (5 mg total) by mouth daily.  ?? labetalol (NORMODYNE) 200 MG tablet Take 1 tablet (200 mg total) by mouth 2 (two) times daily.  ?? lisinopril (ZESTRIL) 20 MG tablet Take 1 tablet (20 mg total) by mouth daily.  ?? spironolactone (ALDACTONE) 25 MG tablet Take 1 tablet (25 mg total) by mouth daily.  ?? ferrous sulfate (SLOW RELEASE IRON) 160 (50 Fe) MG TBCR SR tablet Take 1 tablet (160 mg total) by mouth daily. (Patient not taking: Reported on 07/20/2021)  ?? Iron-Vitamin C (IRON 100/C) 100-250 MG TABS iron  ?? venlafaxine XR (EFFEXOR-XR) 37.5 MG 24 hr capsule venlafaxine ER 37.5 mg  capsule,extended release 24 hr ? Take 1 capsule every day by oral route. ? call office toward end of one month treatment to update on your symtpoms, will then send RF or increase dose as needed  ?? [DISCONTINUED] FLUoxetine (PROZAC) 10 MG tablet Take 10 mg by mouth daily.  ?? [DISCONTINUED] metoprolol succinate (TOPROL-XL) 50 MG 24 hr tablet Take 1 tablet (50 mg total) by mouth daily. Take with or immediately following a meal.  ? ?No facility-administered encounter medications on file as of 11/21/2021.  ? ? ?Follow-up: No follow-ups on file.  ? ?Tommie Raymond, MD ? ?

## 2021-11-22 ENCOUNTER — Ambulatory Visit: Payer: Self-pay | Admitting: Family Medicine

## 2021-11-26 ENCOUNTER — Ambulatory Visit: Payer: Self-pay | Admitting: Internal Medicine

## 2021-12-09 ENCOUNTER — Ambulatory Visit: Payer: Self-pay | Admitting: Cardiology

## 2021-12-13 ENCOUNTER — Ambulatory Visit: Payer: Self-pay | Admitting: Cardiology

## 2022-01-06 ENCOUNTER — Ambulatory Visit (INDEPENDENT_AMBULATORY_CARE_PROVIDER_SITE_OTHER): Payer: Self-pay | Admitting: Cardiology

## 2022-01-06 ENCOUNTER — Encounter: Payer: Self-pay | Admitting: Cardiology

## 2022-01-06 VITALS — BP 176/108 | HR 87 | Ht 63.0 in | Wt 168.6 lb

## 2022-01-06 DIAGNOSIS — R030 Elevated blood-pressure reading, without diagnosis of hypertension: Secondary | ICD-10-CM

## 2022-01-06 DIAGNOSIS — F17200 Nicotine dependence, unspecified, uncomplicated: Secondary | ICD-10-CM

## 2022-01-06 DIAGNOSIS — I10 Essential (primary) hypertension: Secondary | ICD-10-CM | POA: Insufficient documentation

## 2022-01-06 DIAGNOSIS — E663 Overweight: Secondary | ICD-10-CM | POA: Insufficient documentation

## 2022-01-06 MED ORDER — VALSARTAN 80 MG PO TABS: 80.0000 mg | ORAL_TABLET | Freq: Every day | ORAL | 3 refills | Status: DC

## 2022-01-06 MED ORDER — CLONIDINE HCL 0.1 MG PO TABS: 0.1000 mg | ORAL_TABLET | Freq: Every day | ORAL | Status: AC

## 2022-01-06 NOTE — Patient Instructions (Addendum)
Medication Instructions:  ?Your physician has recommended you make the following change in your medication:  ?STOP: Lisinopril  ?START: Valsartan 80 mg once daily - please pick this medication up today and begin taking.   ? ?Please take your blood pressure daily and bring to your next appointment. Please include heart rates. Bring cuff to appointment.  ? ?HOW TO TAKE YOUR BLOOD PRESSURE: ?Rest 5 minutes before taking your blood pressure. ?Don?t smoke or drink caffeinated beverages for at least 30 minutes before. ?Take your blood pressure before (not after) you eat. ?Sit comfortably with your back supported and both feet on the floor (don?t cross your legs). ?Elevate your arm to heart level on a table or a desk. ?Use the proper sized cuff. It should fit smoothly and snugly around your bare upper arm. There should be enough room to slip a fingertip under the cuff. The bottom edge of the cuff should be 1 inch above the crease of the elbow. ?Ideally, take 3 measurements at one sitting and record the average. ? ?*If you need a refill on your cardiac medications before your next appointment, please call your pharmacy* ? ? ?Lab Work: ?None ?If you have labs (blood work) drawn today and your tests are completely normal, you will receive your results only by: ?MyChart Message (if you have MyChart) OR ?A paper copy in the mail ?If you have any lab test that is abnormal or we need to change your treatment, we will call you to review the results. ? ? ?Testing/Procedures: ?None ? ? ?Follow-Up: ?At [facility], you and your health needs are our priority.  As part of our continuing mission to provide you with exceptional heart care, we have created designated Provider Care Teams.  These Care Teams include your primary Cardiologist (physician) and Advanced Practice Providers (APPs -  Physician Assistants and Nurse Practitioners) who all work together to provide you with the care you need, when you need it. ? ?We recommend  signing up for the patient portal called "MyChart".  Sign up information is provided on this After Visit Summary.  MyChart is used to connect with patients for Virtual Visits (Telemedicine).  Patients are able to view lab/test results, encounter notes, upcoming appointments, etc.  Non-urgent messages can be sent to your provider as well.   ?To learn more about what you can do with MyChart, go to [email or web address].   ? ?Your next appointment:   ?2 week(s) ? ?The format for your next appointment:   ?In Person ? ?Provider:   ?Berniece Salines, DO   ? ? ?Other Instructions ? ? ?Important Information About Sugar ? ? ? ? ?  ?

## 2022-01-06 NOTE — Progress Notes (Signed)
?Cardiology Office Note:   ? ?Date:  01/06/2022  ? ?ID:  Keala Drum, DOB Aug 03, 1981, MRN [id number] ? ?PCP:  Georganna Skeans, MD  ?Cardiologist:  Thomasene Ripple, DO  ?Electrophysiologist:  None  ? ?Referring MD: Georganna Skeans, MD  ? ?" I am ok" ? ? ?History of Present Illness:   ? ?[name] [name] is a 41 y.o. female with a hx of hypertension currently on lisinopril 20 mg daily and Aldactone 25 mg daily, iron deficiency anemia, smoker here today to establish cardiac care. ? ?Based on chart review the patient had been hospitalized in November 2022 for hypertensive emergency.  She tells me that she has had longstanding hypertension and has been on multiple medications.  She has taken amlodipine, hydrochlorothiazide and labetalol in the past.  She also has been on metoprolol which has been discontinued. ? ?Tells me that since starting the lisinopril she has noticed worsening dry cough. ? ?She ran out of her antihypertensive medications and had not been taking her medication since Friday.  She tells me she called her PCP but she was told that there would be no refills until she sees cardiologist. ? ?She offers no complaints at this time. ? ?Past Medical History:  ?Diagnosis Date  ? Hypertension   ? Menorrhagia 06/16/2017  ? ? ?Past Surgical History:  ?Procedure Laterality Date  ? CESAREAN SECTION    ? ? ?Current Medications: ?Current Meds  ?Medication Sig  ? spironolactone (ALDACTONE) 25 MG tablet Take 1 tablet (25 mg total) by mouth daily.  ? valsartan (DIOVAN) 80 MG tablet Take 1 tablet (80 mg total) by mouth daily.  ? [DISCONTINUED] lisinopril (ZESTRIL) 20 MG tablet Take 1 tablet (20 mg total) by mouth daily.  ? ?Current Facility-Administered Medications for the 01/06/22 encounter (Office Visit) with Thomasene Ripple, DO  ?Medication  ? cloNIDine (CATAPRES) tablet 0.1 mg  ?  ? ?Allergies:   Latex  ? ?Social History  ? ?Socioeconomic History  ? Marital status: Single  ?  Spouse name: Not on file  ? Number of children: Not on file   ? Years of education: Not on file  ? Highest education level: Not on file  ?Occupational History  ? Not on file  ?Tobacco Use  ? Smoking status: Every Day  ?  Packs/day: 0.10  ?  Types: Cigarettes  ? Smokeless tobacco: Never  ?Substance and Sexual Activity  ? Alcohol use: Yes  ?  Comment: 3/week  ? Drug use: No  ? Sexual activity: Never  ?Other Topics Concern  ? Not on file  ?Social History Narrative  ? Not on file  ? ?Social Determinants of Health  ? ?Financial Resource Strain: Not on file  ?Food Insecurity: Not on file  ?Transportation Needs: Not on file  ?Physical Activity: Not on file  ?Stress: Not on file  ?Social Connections: Not on file  ?  ? ?Family History: ?The patient's family history includes Diabetes in her father and mother; Hypertension in her mother. ? ?ROS:   ?Review of Systems  ?Constitution: Negative for decreased appetite, fever and weight gain.  ?HENT: Negative for congestion, ear discharge, hoarse voice and sore throat.   ?Eyes: Negative for discharge, redness, vision loss in right eye and visual halos.  ?Cardiovascular: Negative for chest pain, dyspnea on exertion, leg swelling, orthopnea and palpitations.  ?Respiratory: Negative for cough, hemoptysis, shortness of breath and snoring.   ?Endocrine: Negative for heat intolerance and polyphagia.  ?Hematologic/Lymphatic: Negative for bleeding problem. Does not bruise/bleed easily.  ?  Skin: Negative for flushing, nail changes, rash and suspicious lesions.  ?Musculoskeletal: Negative for arthritis, joint pain, muscle cramps, myalgias, neck pain and stiffness.  ?Gastrointestinal: Negative for abdominal pain, bowel incontinence, diarrhea and excessive appetite.  ?Genitourinary: Negative for decreased libido, genital sores and incomplete emptying.  ?Neurological: Negative for brief paralysis, focal weakness, headaches and loss of balance.  ?Psychiatric/Behavioral: Negative for altered mental status, depression and suicidal ideas.   ?Allergic/Immunologic: Negative for HIV exposure and persistent infections.  ? ? ?EKGs/Labs/Other Studies Reviewed:   ? ?The following studies were reviewed today: ? ? ?EKG: None today ? ?Recent Labs: ?07/20/2021: B Natriuretic Peptide 96.1; Magnesium 1.9 ?07/21/2021: ALT 16; BUN 13; Creatinine, Ser 0.78; Potassium 3.0; Sodium 136 ?09/12/2021: Hemoglobin 8.7; Platelets 465  ?Recent Lipid Panel ?No results found for: CHOL, TRIG, HDL, CHOLHDL, VLDL, LDLCALC, LDLDIRECT ? ?Physical Exam:   ? ?VS:  BP (!) 176/108 (BP Location: Left Arm, Patient Position: Sitting, Cuff Size: Normal)   Pulse 87   Ht 5\' 3"  (1.6 m)   Wt 168 lb 9.6 oz (76.5 kg)   LMP 12/30/2021   SpO2 99%   BMI 29.87 kg/m?    ? ?Wt Readings from Last 3 Encounters:  ?01/06/22 168 lb 9.6 oz (76.5 kg)  ?11/21/21 168 lb (76.2 kg)  ?09/12/21 164 lb 12.8 oz (74.8 kg)  ?  ? ?GEN: Well nourished, well developed in no acute distress ?HEENT: Normal ?NECK: No JVD; No carotid bruits ?LYMPHATICS: No lymphadenopathy ?CARDIAC: S1S2 noted,RRR, no murmurs, rubs, gallops ?RESPIRATORY:  Clear to auscultation without rales, wheezing or rhonchi  ?ABDOMEN: Soft, non-tender, non-distended, +bowel sounds, no guarding. ?EXTREMITIES: No edema, No cyanosis, no clubbing ?MUSCULOSKELETAL:  No deformity  ?SKIN: Warm and dry ?NEUROLOGIC:  Alert and oriented x 3, non-focal ?PSYCHIATRIC:  Normal affect, good insight ? ?ASSESSMENT:   ? ?1. Accelerated hypertension   ?2. Elevated blood pressure reading   ?3. Overweight (BMI 25.0-29.9)   ?4. Smoker   ? ?PLAN:   ? ? ?Her blood pressure is elevated in the office today 240/120 mmHg.  She had not taking her medications for 3 days.  I gave the patient 0.1 mg clonidine x1 dose, kept her in office for 2 hours.  Blood pressure improved slightly. ? ?Since she has had a reaction to the lisinopril we will stop this medication.  We will start patient on valsartan 80 mg daily.  Continue her Aldactone for now. ?I discussed with the patient that she may  need multiple antihypertensive to help to keep her less than 130/80 mmHg. ? ?Chart review she has not had an echocardiogram so we cannot place an echocardiogram to assess for any structural abnormality also including diastolic dysfunction. ? ? ?She is a smoker we discussed potential for smoking cessation she has not quite ready yet for this.  I told the patient if she is ready to let me know I will refer her to our smoking cessation program. ? ?The patient understands the need to lose weight with diet and exercise. We have discussed specific strategies for this. ?The patient is in agreement with the above plan. The patient left the office in stable condition.  The patient will follow up in ? ? ?Medication Adjustments/Labs and Tests Ordered: ?Current medicines are reviewed at length with the patient today.  Concerns regarding medicines are outlined above.  ?No orders of the defined types were placed in this encounter. ? ?Meds ordered this encounter  ?Medications  ? cloNIDine (CATAPRES) tablet 0.1 mg  ?  valsartan (DIOVAN) 80 MG tablet  ?  Sig: Take 1 tablet (80 mg total) by mouth daily.  ?  Dispense:  90 tablet  ?  Refill:  3  ? ? ?Patient Instructions  ?Medication Instructions:  ?Your physician has recommended you make the following change in your medication:  ?STOP: Lisinopril  ?START: Valsartan 80 mg once daily - please pick this medication up today and begin taking.   ? ?Please take your blood pressure daily and bring to your next appointment. Please include heart rates. Bring cuff to appointment.  ? ?HOW TO TAKE YOUR BLOOD PRESSURE: ?Rest 5 minutes before taking your blood pressure. ?Don?t smoke or drink caffeinated beverages for at least 30 minutes before. ?Take your blood pressure before (not after) you eat. ?Sit comfortably with your back supported and both feet on the floor (don?t cross your legs). ?Elevate your arm to heart level on a table or a desk. ?Use the proper sized cuff. It should fit smoothly and  snugly around your bare upper arm. There should be enough room to slip a fingertip under the cuff. The bottom edge of the cuff should be 1 inch above the crease of the elbow. ?Ideally, take 3 measurements at one sitting and reco

## 2022-01-07 ENCOUNTER — Encounter: Payer: Self-pay | Admitting: Cardiology

## 2022-01-07 ENCOUNTER — Telehealth: Payer: Self-pay | Admitting: Licensed Clinical Social Worker

## 2022-01-07 NOTE — Progress Notes (Signed)
?Heart and Vascular Care Navigation ? ?01/07/2022 ? ?[name] [name] ?Jun 21, 1981 ?[id number] ? ?Reason for Referral:  ?Uninsured  ?Engaged with patient by telephone for initial visit for Heart and Vascular Care Coordination. ?                                                                                                  ?Assessment:                                     ?LCSW reached out to pt and was able to reach her at [phone] again this afternoon. Introduced self, role, reason for call. Pt confirmed home address, PCP, and that she currently lives with her adult children. She is employed but does not have health coverage through that job, pt has applied and been denied for Medicaid in the past. ? ?We discussed cone financial assistance application and [employer]. Pt amenable to these being mailed to her. No additional cost of living concerns voiced to this writer at this time. She denies also any concerns with obtaining or affording medications. Pt encouraged to reach out to this writer with any questions.  ? ?HRT/VAS Care Coordination   ? ? Patients Home Cardiology Office [facility]  ? Outpatient Care Team Social Worker  ? Social Worker Name: Esmeralda [facility] [phone]  ? Living arrangements for the past 2 months Apartment  ? Lives with: Adult Children  ? Patient Current Insurance Coverage Self-Pay  ? Patient Has Concern With Paying Medical Bills Yes  ? Patient Concerns With Medical Bills no coverage  ? Medical Bill Referrals: [employer] and [employer] mailed  ? Does Patient Have Prescription Coverage? No  ? Home Assistive Devices/Equipment None  ? ?  ? ? ?Social History:                                                                             ?SDOH Screenings  ? ?Alcohol Screen: Not on file  ?Depression (PHQ2-9): Low Risk   ? PHQ-2 Score: 0  ?Financial Resource Strain: Low Risk   ? Difficulty of Paying Living Expenses: Not very hard  ?Food Insecurity: No Food  Insecurity  ? Worried About Programme researcher, broadcasting/film/video in the Last Year: Never true  ? Ran Out of Food in the Last Year: Never true  ?Housing: Low Risk   ? Last Housing Risk Score: 0  ?Physical Activity: Not on file  ?Social Connections: Not on file  ?Stress: Not on file  ?Tobacco Use: High Risk  ? Smoking Tobacco Use: Every Day  ? Smokeless Tobacco Use: Never  ? Passive Exposure: Not on file  ?Transportation Needs: No Transportation Needs  ? Lack of Transportation (Medical): No  ?  Lack of Transportation (Non-Medical): No  ? ? ?SDOH Interventions: ?Financial Resources:  Financial Strain Interventions: Other (Comment) ([employer] mailed) ?Financial Counseling for [employer] Program  ?Food Insecurity:  Food Insecurity Interventions: Intervention Not Indicated  ?Housing Insecurity:     ?Transportation:   Transportation Interventions: Intervention Not Indicated  ? ? ?Other Care Navigation Interventions:    ? ?Provided Pharmacy assistance resources  Pt denies any pharmacy issues at this time.   ? ?Follow-up plan:   ?Mailed my card, [employer] and [employer]. Pt encouraged to reach out to me with any questions/concerns. I remain available.  ? ? ? ?

## 2022-01-07 NOTE — Addendum Note (Signed)
Addended by: Daryll Brod on: 01/07/2022 10:36 AM ? ? Modules accepted: Orders ? ?

## 2022-01-28 ENCOUNTER — Ambulatory Visit (INDEPENDENT_AMBULATORY_CARE_PROVIDER_SITE_OTHER): Payer: Self-pay | Admitting: Cardiology

## 2022-01-28 ENCOUNTER — Encounter: Payer: Self-pay | Admitting: Cardiology

## 2022-01-28 VITALS — BP 172/104 | HR 91 | Ht 63.0 in | Wt 172.6 lb

## 2022-01-28 DIAGNOSIS — Z79899 Other long term (current) drug therapy: Secondary | ICD-10-CM

## 2022-01-28 DIAGNOSIS — F17200 Nicotine dependence, unspecified, uncomplicated: Secondary | ICD-10-CM

## 2022-01-28 DIAGNOSIS — I10 Essential (primary) hypertension: Secondary | ICD-10-CM

## 2022-01-28 LAB — BASIC METABOLIC PANEL
BUN/Creatinine Ratio: 17 (ref 9–23)
BUN: 15 mg/dL (ref 6–24)
CO2: 23 mmol/L (ref 20–29)
Calcium: 9.1 mg/dL (ref 8.7–10.2)
Chloride: 102 mmol/L (ref 96–106)
Creatinine, Ser: 0.9 mg/dL (ref 0.57–1.00)
Glucose: 97 mg/dL (ref 70–99)
Potassium: 4.8 mmol/L (ref 3.5–5.2)
Sodium: 137 mmol/L (ref 134–144)
eGFR: 81 mL/min/{1.73_m2} (ref >59)

## 2022-01-28 LAB — MAGNESIUM: Magnesium: 2.1 mg/dL (ref 1.6–2.3)

## 2022-01-28 MED ORDER — VALSARTAN 160 MG PO TABS: 160.0000 mg | ORAL_TABLET | Freq: Every day | ORAL | 3 refills | Status: DC

## 2022-01-28 MED ORDER — AMLODIPINE BESYLATE 5 MG PO TABS: 5.0000 mg | ORAL_TABLET | Freq: Every day | ORAL | 3 refills | Status: DC

## 2022-01-28 NOTE — Progress Notes (Signed)
Cardiology Office Note:    Date:  01/28/2022   ID:  [name] [name], DOB 07/31/1981, MRN [id number]  PCP:  Dorna Mai, MD  Cardiologist:  Berniece Salines, DO  Electrophysiologist:  None   Referring MD: Dorna Mai, MD   " I am doing fine"  History of Present Illness:    [name] [name] is a 41 y.o. female with a hx of of hypertension currently on lisinopril 20 mg daily and Aldactone 25 mg daily, iron deficiency anemia, smoker here today to establish cardiac care.   Based on chart review the patient had been hospitalized in November 2022 for hypertensive emergency.  She tells me that she has had longstanding hypertension and has been on multiple medications.  She has taken amlodipine, hydrochlorothiazide and labetalol in the past.  She also has been on metoprolol which has been discontinued.  I saw the patient on Jan 06, 2022 at that time in the office her blood pressure was significantly elevated 240/120 mmHg, we did give the patient a dose of clonidine and kept her for 2 hours for monitoring.  During that visit I transition the patient off lisinopril to valsartan.  And continue her Aldactone. I also ordered echocardiogram she has not gotten it.  Since her visit she tells me she did buy herself a blood pressure cuff.  Her blood pressure at home has been running in the [id number] systolics in 0000000 diastolics.  She offers no significant complaints today. Taking her medications as prescribed.   Past Medical History:  Diagnosis Date   Hypertension    Menorrhagia 06/16/2017    Past Surgical History:  Procedure Laterality Date   CESAREAN SECTION      Current Medications: Current Meds  Medication Sig   amLODipine (NORVASC) 5 MG tablet Take 1 tablet (5 mg total) by mouth daily.   ferrous sulfate (SLOW RELEASE IRON) 160 (50 Fe) MG TBCR SR tablet Take 1 tablet (160 mg total) by mouth daily.   spironolactone (ALDACTONE) 25 MG tablet Take 1 tablet (25 mg total) by mouth daily.   valsartan  (DIOVAN) 160 MG tablet Take 1 tablet (160 mg total) by mouth daily.   [DISCONTINUED] valsartan (DIOVAN) 80 MG tablet Take 1 tablet (80 mg total) by mouth daily.   Current Facility-Administered Medications for the 01/28/22 encounter (Office Visit) with Berniece Salines, DO  Medication   cloNIDine (CATAPRES) tablet 0.1 mg     Allergies:   Latex   Social History   Socioeconomic History   Marital status: Single    Spouse name: Not on file   Number of children: Not on file   Years of education: Not on file   Highest education level: Not on file  Occupational History   Not on file  Tobacco Use   Smoking status: Every Day    Packs/day: 0.10    Types: Cigarettes   Smokeless tobacco: Never  Substance and Sexual Activity   Alcohol use: Yes    Comment: 3/week   Drug use: No   Sexual activity: Never  Other Topics Concern   Not on file  Social History Narrative   Not on file   Social Determinants of Health   Financial Resource Strain: Low Risk    Difficulty of Paying Living Expenses: Not very hard  Food Insecurity: No Food Insecurity   Worried About Running Out of Food in the Last Year: Never true   Ran Out of Food in the Last Year: Never true  Transportation Needs: No Transportation Needs  Lack of Transportation (Medical): No   Lack of Transportation (Non-Medical): No  Physical Activity: Not on file  Stress: Not on file  Social Connections: Not on file     Family History: The patient's family history includes Diabetes in her father and mother; Hypertension in her mother.  ROS:   Review of Systems  Constitution: Negative for decreased appetite, fever and weight gain.  HENT: Negative for congestion, ear discharge, hoarse voice and sore throat.   Eyes: Negative for discharge, redness, vision loss in right eye and visual halos.  Cardiovascular: Negative for chest pain, dyspnea on exertion, leg swelling, orthopnea and palpitations.  Respiratory: Negative for cough, hemoptysis,  shortness of breath and snoring.   Endocrine: Negative for heat intolerance and polyphagia.  Hematologic/Lymphatic: Negative for bleeding problem. Does not bruise/bleed easily.  Skin: Negative for flushing, nail changes, rash and suspicious lesions.  Musculoskeletal: Negative for arthritis, joint pain, muscle cramps, myalgias, neck pain and stiffness.  Gastrointestinal: Negative for abdominal pain, bowel incontinence, diarrhea and excessive appetite.  Genitourinary: Negative for decreased libido, genital sores and incomplete emptying.  Neurological: Negative for brief paralysis, focal weakness, headaches and loss of balance.  Psychiatric/Behavioral: Negative for altered mental status, depression and suicidal ideas.  Allergic/Immunologic: Negative for HIV exposure and persistent infections.    EKGs/Labs/Other Studies Reviewed:    The following studies were reviewed today:   EKG: None today  Recent Labs: 07/20/2021: B Natriuretic Peptide 96.1; Magnesium 1.9 07/21/2021: ALT 16; BUN 13; Creatinine, Ser 0.78; Potassium 3.0; Sodium 136 09/12/2021: Hemoglobin 8.7; Platelets 465  Recent Lipid Panel No results found for: CHOL, TRIG, HDL, CHOLHDL, VLDL, LDLCALC, LDLDIRECT  Physical Exam:    VS:  BP (!) 172/104   Pulse 91   Ht 5\' 3"  (1.6 m)   Wt 172 lb 9.6 oz (78.3 kg)   LMP 12/30/2021   SpO2 99%   BMI 30.57 kg/m     Wt Readings from Last 3 Encounters:  01/28/22 172 lb 9.6 oz (78.3 kg)  01/06/22 168 lb 9.6 oz (76.5 kg)  11/21/21 168 lb (76.2 kg)     GEN: Well nourished, well developed in no acute distress HEENT: Normal NECK: No JVD; No carotid bruits LYMPHATICS: No lymphadenopathy CARDIAC: S1S2 noted,RRR, no murmurs, rubs, gallops RESPIRATORY:  Clear to auscultation without rales, wheezing or rhonchi  ABDOMEN: Soft, non-tender, non-distended, +bowel sounds, no guarding. EXTREMITIES: No edema, No cyanosis, no clubbing MUSCULOSKELETAL:  No deformity  SKIN: Warm and  dry NEUROLOGIC:  Alert and oriented x 3, non-focal PSYCHIATRIC:  Normal affect, good insight  ASSESSMENT:    1. Accelerated hypertension   2. Medication management   3. Hypertension, unspecified type   4. Smoker    PLAN:     1.  Her blood pressure is elevated in the office today but compared to her previous there is some improvement.  We will like to do is titrate up her medications valsartan to 160 mg daily, add amlodipine 5 mg daily and continue Aldactone.  Blood pressure target is less than 130/80 mmHg and I did educate the patient about this.  She will take her blood pressure daily and send me information next week.  2.  She needs an echocardiogram to assess for any structural abnormality including diastolic dysfunction.  3.  Smoking cessation advised.  4.  Fluoroscopy be done today for BMP and mag.  The patient is in agreement with the above plan. The patient left the office in stable condition.  The patient will  follow up in 16 weeks.   Medication Adjustments/Labs and Tests Ordered: Current medicines are reviewed at length with the patient today.  Concerns regarding medicines are outlined above.  Orders Placed This Encounter  Procedures   Basic Metabolic Panel (BMET)   Magnesium   ECHOCARDIOGRAM COMPLETE   Meds ordered this encounter  Medications   amLODipine (NORVASC) 5 MG tablet    Sig: Take 1 tablet (5 mg total) by mouth daily.    Dispense:  90 tablet    Refill:  3   valsartan (DIOVAN) 160 MG tablet    Sig: Take 1 tablet (160 mg total) by mouth daily.    Dispense:  90 tablet    Refill:  3    Patient Instructions  Medication Instructions:  Your physician has recommended you make the following change in your medication:  START:Amlodipine 5 mg once daily INCREASE: Valsartan 160 mg once daily  Please take your blood pressure daily and send in a MyChart message on Monday. Please include heart rates.   HOW TO TAKE YOUR BLOOD PRESSURE: Rest 5 minutes before  taking your blood pressure. Don't smoke or drink caffeinated beverages for at least 30 minutes before. Take your blood pressure before (not after) you eat. Sit comfortably with your back supported and both feet on the floor (don't cross your legs). Elevate your arm to heart level on a table or a desk. Use the proper sized cuff. It should fit smoothly and snugly around your bare upper arm. There should be enough room to slip a fingertip under the cuff. The bottom edge of the cuff should be 1 inch above the crease of the elbow. Ideally, take 3 measurements at one sitting and record the average.  *If you need a refill on your cardiac medications before your next appointment, please call your pharmacy*   Lab Work: Your physician recommends that you return for lab work in:  TODAY: BMET, [place] If you have labs (blood work) drawn today and your tests are completely normal, you will receive your results only by: MyChart Message (if you have [place]) OR A paper copy in the mail If you have any lab test that is abnormal or we need to change your treatment, we will call you to review the results.   Testing/Procedures: Your physician has requested that you have an echocardiogram. Echocardiography is a painless test that uses sound waves to create images of your heart. It provides your doctor with information about the size and shape of your heart and how well your heart's chambers and valves are working. This procedure takes approximately one hour. There are no restrictions for this procedure.    Follow-Up: At [facility], you and your health needs are our priority.  As part of our continuing mission to provide you with exceptional heart care, we have created designated Provider Care Teams.  These Care Teams include your primary Cardiologist (physician) and Advanced Practice Providers (APPs -  Physician Assistants and Nurse Practitioners) who all work together to provide you with the care you need,  when you need it.  We recommend signing up for the patient portal called "MyChart".  Sign up information is provided on this After Visit Summary.  MyChart is used to connect with patients for Virtual Visits (Telemedicine).  Patients are able to view lab/test results, encounter notes, upcoming appointments, etc.  Non-urgent messages can be sent to your provider as well.   To learn more about what you can do with MyChart, go to  [email or web address].    Your next appointment:   16 week(s)  The format for your next appointment:   In Person  Provider:   Berniece Salines, DO     Other Instructions   Important Information About Sugar         Adopting a Healthy Lifestyle.  Know what a healthy weight is for you (roughly BMI <25) and aim to maintain this   Aim for 7+ servings of fruits and vegetables daily   65-80+ fluid ounces of water or unsweet tea for healthy kidneys   Limit to max 1 drink of alcohol per day; avoid smoking/tobacco   Limit animal fats in diet for cholesterol and heart health - choose grass fed whenever available   Avoid highly processed foods, and foods high in saturated/trans fats   Aim for low stress - take time to unwind and care for your mental health   Aim for 150 min of moderate intensity exercise weekly for heart health, and weights twice weekly for bone health   Aim for 7-9 hours of sleep daily   When it comes to diets, agreement about the perfect plan isnt easy to find, even among the experts. Experts at the [employer] developed an idea known as the Healthy Eating Plate. Just imagine a plate divided into logical, healthy portions.   The emphasis is on diet quality:   Load up on vegetables and fruits - one-half of your plate: Aim for color and variety, and remember that potatoes dont count.   Go for whole grains - one-quarter of your plate: Whole wheat, barley, wheat berries, quinoa, oats, brown rice, and foods made with them.  If you want pasta, go with whole wheat pasta.   Protein power - one-quarter of your plate: Fish, chicken, beans, and nuts are all healthy, versatile protein sources. Limit red meat.   The diet, however, does go beyond the plate, offering a few other suggestions.   Use healthy plant oils, such as olive, canola, soy, corn, sunflower and peanut. Check the labels, and avoid partially hydrogenated oil, which have unhealthy trans fats.   If youre thirsty, drink water. Coffee and tea are good in moderation, but skip sugary drinks and limit milk and dairy products to one or two daily servings.   The type of carbohydrate in the diet is more important than the amount. Some sources of carbohydrates, such as vegetables, fruits, whole grains, and beans-are healthier than others.   Finally, stay active  Signed, Berniece Salines, DO  01/28/2022 9:12 AM    [place]

## 2022-01-28 NOTE — Patient Instructions (Addendum)
Medication Instructions:  Your physician has recommended you make the following change in your medication:  START:Amlodipine 5 mg once daily INCREASE: Valsartan 160 mg once daily  Please take your blood pressure daily and send in a MyChart message on Monday. Please include heart rates.   HOW TO TAKE YOUR BLOOD PRESSURE: Rest 5 minutes before taking your blood pressure. Don't smoke or drink caffeinated beverages for at least 30 minutes before. Take your blood pressure before (not after) you eat. Sit comfortably with your back supported and both feet on the floor (don't cross your legs). Elevate your arm to heart level on a table or a desk. Use the proper sized cuff. It should fit smoothly and snugly around your bare upper arm. There should be enough room to slip a fingertip under the cuff. The bottom edge of the cuff should be 1 inch above the crease of the elbow. Ideally, take 3 measurements at one sitting and record the average.  *If you need a refill on your cardiac medications before your next appointment, please call your pharmacy*   Lab Work: Your physician recommends that you return for lab work in:  TODAY: BMET, [place] If you have labs (blood work) drawn today and your tests are completely normal, you will receive your results only by: MyChart Message (if you have [place]) OR A paper copy in the mail If you have any lab test that is abnormal or we need to change your treatment, we will call you to review the results.   Testing/Procedures: Your physician has requested that you have an echocardiogram. Echocardiography is a painless test that uses sound waves to create images of your heart. It provides your doctor with information about the size and shape of your heart and how well your heart's chambers and valves are working. This procedure takes approximately one hour. There are no restrictions for this procedure.    Follow-Up: At [facility], you and your health needs are our  priority.  As part of our continuing mission to provide you with exceptional heart care, we have created designated Provider Care Teams.  These Care Teams include your primary Cardiologist (physician) and Advanced Practice Providers (APPs -  Physician Assistants and Nurse Practitioners) who all work together to provide you with the care you need, when you need it.  We recommend signing up for the patient portal called "MyChart".  Sign up information is provided on this After Visit Summary.  MyChart is used to connect with patients for Virtual Visits (Telemedicine).  Patients are able to view lab/test results, encounter notes, upcoming appointments, etc.  Non-urgent messages can be sent to your provider as well.   To learn more about what you can do with MyChart, go to [email or web address].    Your next appointment:   16 week(s)  The format for your next appointment:   In Person  Provider:   Berniece Salines, DO     Other Instructions   Important Information About Sugar

## 2022-02-06 ENCOUNTER — Telehealth: Payer: Self-pay | Admitting: Licensed Clinical Social Worker

## 2022-02-06 NOTE — Telephone Encounter (Signed)
LCSW was able to reach pt this morning at [phone].  Introduced self, role, reason for call. Pt confirms she has received assistance applications that this writer sent but that she has been very busy with her children's graduations and hasn't had time to look over them but will and will call me back.   Pt then called me back shortly after we hung up. She is looking at applications- shares she sees that I have started the with information available on EMR, she will work on collecting needed documents and complete the applications. I let her know that should she want to meet in person to review them, need any clarification or any additional assistance she can call,text, or meet in person with me (as available). I will f/u moving forward for general check in.   Westley Hummer, MSW, [place]  [phone]- work cell phone (preferred) [phone]- desk phone

## 2022-02-13 ENCOUNTER — Telehealth: Payer: Self-pay | Admitting: Family Medicine

## 2022-02-13 ENCOUNTER — Encounter: Payer: Self-pay | Admitting: Cardiology

## 2022-02-13 ENCOUNTER — Other Ambulatory Visit: Payer: Self-pay | Admitting: Family Medicine

## 2022-02-13 ENCOUNTER — Ambulatory Visit (HOSPITAL_COMMUNITY): Payer: Self-pay | Attending: Internal Medicine

## 2022-02-13 ENCOUNTER — Encounter: Payer: Self-pay | Admitting: Family Medicine

## 2022-02-13 DIAGNOSIS — I10 Essential (primary) hypertension: Secondary | ICD-10-CM

## 2022-02-13 LAB — ECHOCARDIOGRAM COMPLETE
Area-P 1/2: 3.27 cm2
S' Lateral: 2.4 cm

## 2022-02-13 NOTE — Telephone Encounter (Signed)
Not a provider in this practice. Requested Prescriptions  Pending Prescriptions Disp Refills  . amLODipine (NORVASC) 5 MG tablet 90 tablet 3    Sig: Take 1 tablet (5 mg total) by mouth daily.     Cardiovascular: Calcium Channel Blockers 2 Failed - 02/13/2022  4:26 PM      Failed - Last BP in normal range    BP Readings from Last 1 Encounters:  01/28/22 (!) 172/104         Passed - Last Heart Rate in normal range    Pulse Readings from Last 1 Encounters:  01/28/22 91         Passed - Valid encounter within last 6 months    Recent Outpatient Visits          2 months ago Uncontrolled hypertension   [facility] at [facility], [place]   5 months ago Uncontrolled hypertension   Primary Care at [facility], [place]   7 years ago Gastroenteritis   Primary Care at [place], [place], [place]      Future Appointments            In 3 months Tobb, Kardie, DO [facility], [facility]

## 2022-02-13 NOTE — Telephone Encounter (Signed)
Medication Refill - Medication: amLODipine (NORVASC) 5 MG tablet [[id number]]   Has the patient contacted their pharmacy? Yes.     Preferred Pharmacy (with phone number or street name):  [id number] [place], [place] - [address]  [address] [place] [address]  Phone: [phone] Fax: [phone]  Hours: Not open 24 hours     Has the patient been seen for an appointment in the last year OR does the patient have an upcoming appointment? Yes.  3/23  Agent: Please be advised that RX refills may take up to 3 business days. We ask that you follow-up with your pharmacy.

## 2022-02-13 NOTE — Telephone Encounter (Signed)
Discontinued 01/06/22 Requested Prescriptions  Pending Prescriptions Disp Refills  . lisinopril (ZESTRIL) 20 MG tablet [Pharmacy Med Name: LISINOPRIL 20 MG TABLET] 90 tablet 0    Sig: TAKE 1 TABLET BY MOUTH EVERY DAY     Cardiovascular:  ACE Inhibitors Failed - 02/13/2022  7:36 AM      Failed - Last BP in normal range    BP Readings from Last 1 Encounters:  01/28/22 (!) 172/104         Passed - Cr in normal range and within 180 days    Creatinine, Ser  Date Value Ref Range Status  01/28/2022 0.90 0.57 - 1.00 mg/dL Final         Passed - K in normal range and within 180 days    Potassium  Date Value Ref Range Status  01/28/2022 4.8 3.5 - 5.2 mmol/L Final         Passed - Patient is not pregnant      Passed - Valid encounter within last 6 months    Recent Outpatient Visits          2 months ago Uncontrolled hypertension   [facility] Care at [facility], [place]   5 months ago Uncontrolled hypertension   Primary Care at [facility], [place]   7 years ago Gastroenteritis   Primary Care at [place], [place] [address], [place]      Future Appointments            In 3 months [name], Kardie, DO [facility], [facility]

## 2022-02-14 ENCOUNTER — Other Ambulatory Visit: Payer: Self-pay | Admitting: *Deleted

## 2022-02-14 ENCOUNTER — Telehealth: Payer: Self-pay

## 2022-02-14 ENCOUNTER — Other Ambulatory Visit: Payer: Self-pay

## 2022-02-14 MED ORDER — SPIRONOLACTONE 25 MG PO TABS: 25.0000 mg | ORAL_TABLET | Freq: Every day | ORAL | 3 refills | Status: DC

## 2022-02-14 MED ORDER — VALSARTAN 320 MG PO TABS: 320.0000 mg | ORAL_TABLET | Freq: Every day | ORAL | 3 refills | Status: DC

## 2022-02-14 NOTE — Telephone Encounter (Signed)
Called pharmacy to cancel Spironolactone and inform her a new dose of Valsartan will be ordered. She verbalized understanding and cancelled Spironolactone.

## 2022-02-14 NOTE — Telephone Encounter (Signed)
Refills has been sent to the pharmacy. 

## 2022-02-14 NOTE — Telephone Encounter (Signed)
Patient walked into clinic wanting a refill on spironolactone that her PCP wrote. I spoke with Dr. Servando Salina, wo ordered to discontinue spironolactone and increase valsartan to 320 mg per day. Also, Dr. Servando Salina wanted the patient to know that her echo was normal; and there was thickening of the heart walls due to hypertension. I went back to the lobby to speak with patient. She had left. I called patient and relayed the above notation. She voiced understanding and had not questions or concerns at this time.

## 2022-02-19 ENCOUNTER — Other Ambulatory Visit: Payer: Self-pay | Admitting: Family Medicine

## 2022-02-19 MED ORDER — SPIRONOLACTONE 25 MG PO TABS: 25.0000 mg | ORAL_TABLET | Freq: Every day | ORAL | 0 refills | Status: DC

## 2022-03-14 ENCOUNTER — Telehealth: Payer: Self-pay | Admitting: Licensed Clinical Social Worker

## 2022-03-14 NOTE — Telephone Encounter (Signed)
H&V Care Navigation CSW Progress Note  Clinical Social Worker contacted patient by phone to f/u on assistance applications. No answer at [phone], will no actively continue to follow. Pt has my number and contact information, remain available as needed should pt return my call.   Patient is participating in a Managed Medicaid Plan:  No, self pay only  SDOH Screenings   Alcohol Screen: Not on file  Depression (PHQ2-9): Low Risk  (09/12/2021)   Depression (PHQ2-9)    PHQ-2 Score: 0  Financial Resource Strain: Low Risk  (01/07/2022)   Overall Financial Resource Strain (CARDIA)    Difficulty of Paying Living Expenses: Not very hard  Food Insecurity: No Food Insecurity (01/07/2022)   Hunger Vital Sign    Worried About Running Out of Food in the Last Year: Never true    Ran Out of Food in the Last Year: Never true  Housing: Low Risk  (01/07/2022)   Housing    Last Housing Risk Score: 0  Physical Activity: Not on file  Social Connections: Not on file  Stress: Not on file  Tobacco Use: High Risk (01/28/2022)   Patient History    Smoking Tobacco Use: Every Day    Smokeless Tobacco Use: Never    Passive Exposure: Not on file  Transportation Needs: No Transportation Needs (01/07/2022)   PRAPARE - Transportation    Lack of Transportation (Medical): No    Lack of Transportation (Non-Medical): No    Octavio Graves, MSW, LCSW Clinical Social Worker [facility] Heart/Vascular Care Navigation  [phone]- work cell phone (preferred) [phone]- desk phone

## 2022-05-23 ENCOUNTER — Ambulatory Visit: Payer: Self-pay | Attending: Cardiology | Admitting: Cardiology

## 2022-06-27 ENCOUNTER — Ambulatory Visit: Payer: Self-pay | Attending: Cardiology | Admitting: Cardiology

## 2022-06-27 ENCOUNTER — Encounter: Payer: Self-pay | Admitting: Cardiology

## 2022-06-27 VITALS — BP 152/94 | HR 77 | Ht 63.5 in | Wt 167.4 lb

## 2022-06-27 DIAGNOSIS — E663 Overweight: Secondary | ICD-10-CM

## 2022-06-27 DIAGNOSIS — Z720 Tobacco use: Secondary | ICD-10-CM

## 2022-06-27 DIAGNOSIS — I10 Essential (primary) hypertension: Secondary | ICD-10-CM

## 2022-06-27 MED ORDER — HYDROCHLOROTHIAZIDE 12.5 MG PO CAPS: 12.5000 mg | ORAL_CAPSULE | Freq: Every day | ORAL | 3 refills | Status: DC

## 2022-06-27 MED ORDER — AMLODIPINE BESYLATE 10 MG PO TABS: 10.0000 mg | ORAL_TABLET | Freq: Every day | ORAL | 3 refills | Status: DC

## 2022-06-27 NOTE — Patient Instructions (Signed)
Medication Instructions:  Your physician has recommended you make the following change in your medication:  INCREASE: Amlodipine 10mg  daily. START: Hydrochlorothiazide (HCTZ) 12.5mg  daily.   *If you need a refill on your cardiac medications before your next appointment, please call your pharmacy*  Please take your blood pressure daily for 2 weeks and send in a MyChart message. Please include heart rates.   HOW TO TAKE YOUR BLOOD PRESSURE: Rest 5 minutes before taking your blood pressure. Don't smoke or drink caffeinated beverages for at least 30 minutes before. Take your blood pressure before (not after) you eat. Sit comfortably with your back supported and both feet on the floor (don't cross your legs). Elevate your arm to heart level on a table or a desk. Use the proper sized cuff. It should fit smoothly and snugly around your bare upper arm. There should be enough room to slip a fingertip under the cuff. The bottom edge of the cuff should be 1 inch above the crease of the elbow. Ideally, take 3 measurements at one sitting and record the average.    Lab Work: NONE If you have labs (blood work) drawn today and your tests are completely normal, you will receive your results only by: [place] (if you have MyChart) OR A paper copy in the mail If you have any lab test that is abnormal or we need to change your treatment, we will call you to review the results.   Testing/Procedures: NONE   Follow-Up: At [facility], you and your health needs are our priority.  As part of our continuing mission to provide you with exceptional heart care, we have created designated Provider Care Teams.  These Care Teams include your primary Cardiologist (physician) and Advanced Practice Providers (APPs -  Physician Assistants and Nurse Practitioners) who all work together to provide you with the care you need, when you need it.  We recommend signing up for the patient portal called  "MyChart".  Sign up information is provided on this After Visit Summary.  MyChart is used to connect with patients for Virtual Visits (Telemedicine).  Patients are able to view lab/test results, encounter notes, upcoming appointments, etc.  Non-urgent messages can be sent to your provider as well.   To learn more about what you can do with MyChart, go to [email or web address].    Your next appointment:   4 month(s)  The format for your next appointment:   In Person  Provider:   Berniece Salines, DO

## 2022-06-27 NOTE — Progress Notes (Signed)
Cardiology Office Note:    Date:  06/27/2022   ID:  [name] [name], DOB May 14, 1981, MRN [id number]  PCP:  Georganna Skeans, MD  Cardiologist:  Thomasene Ripple, DO  Electrophysiologist:  None   Referring MD: Georganna Skeans, MD   " I am doing fine"  History of Present Illness:    [name] [name] is a 41 y.o. female with a hx of of hypertension currently on lisinopril 20 mg daily and Aldactone 25 mg daily, iron deficiency anemia, smoker here today to establish cardiac care.   Based on chart review the patient had been hospitalized in November 2022 for hypertensive emergency.  She tells me that she has had longstanding hypertension and has been on multiple medications.  She has taken amlodipine, hydrochlorothiazide and labetalol in the past.  She also has been on metoprolol which has been discontinued.  I saw the patient on Jan 06, 2022 at that time in the office her blood pressure was significantly elevated 240/120 mmHg, we did give the patient a dose of clonidine and kept her for 2 hours for monitoring.  During that visit I transition the patient off lisinopril to valsartan.  And continue her Aldactone.  I saw the patient on Jan 28, 2022 at that time her blood pressure was still elevated we adjusted her medications.  I added amlodipine 5 mg daily.  Since I saw the patient we had needed to stop the Aldactone increase the valsartan.  She is hypertensive in the office today.   Past Medical History:  Diagnosis Date   Hypertension    Menorrhagia 06/16/2017    Past Surgical History:  Procedure Laterality Date   CESAREAN SECTION      Current Medications: Current Meds  Medication Sig   hydrochlorothiazide (MICROZIDE) 12.5 MG capsule Take 1 capsule (12.5 mg total) by mouth daily.   valsartan (DIOVAN) 320 MG tablet Take 1 tablet (320 mg total) by mouth daily.   [DISCONTINUED] amLODipine (NORVASC) 5 MG tablet Take 1 tablet (5 mg total) by mouth daily.   Current Facility-Administered Medications for  the 06/27/22 encounter (Office Visit) with Thomasene Ripple, DO  Medication   cloNIDine (CATAPRES) tablet 0.1 mg     Allergies:   Latex   Social History   Socioeconomic History   Marital status: Single    Spouse name: Not on file   Number of children: Not on file   Years of education: Not on file   Highest education level: Not on file  Occupational History   Not on file  Tobacco Use   Smoking status: Every Day    Packs/day: 0.10    Types: Cigarettes   Smokeless tobacco: Never  Substance and Sexual Activity   Alcohol use: Yes    Comment: 3/week   Drug use: No   Sexual activity: Never  Other Topics Concern   Not on file  Social History Narrative   Not on file   Social Determinants of Health   Financial Resource Strain: Low Risk  (01/07/2022)   Overall Financial Resource Strain (CARDIA)    Difficulty of Paying Living Expenses: Not very hard  Food Insecurity: No Food Insecurity (01/07/2022)   Hunger Vital Sign    Worried About Running Out of Food in the Last Year: Never true    Ran Out of Food in the Last Year: Never true  Transportation Needs: No Transportation Needs (01/07/2022)   PRAPARE - Administrator, Civil Service (Medical): No    Lack of Transportation (Non-Medical):  No  Physical Activity: Not on file  Stress: Not on file  Social Connections: Not on file     Family History: The patient's family history includes Diabetes in her father and mother; Hypertension in her mother.  ROS:   Review of Systems  Constitution: Negative for decreased appetite, fever and weight gain.  HENT: Negative for congestion, ear discharge, hoarse voice and sore throat.   Eyes: Negative for discharge, redness, vision loss in right eye and visual halos.  Cardiovascular: Negative for chest pain, dyspnea on exertion, leg swelling, orthopnea and palpitations.  Respiratory: Negative for cough, hemoptysis, shortness of breath and snoring.   Endocrine: Negative for heat intolerance  and polyphagia.  Hematologic/Lymphatic: Negative for bleeding problem. Does not bruise/bleed easily.  Skin: Negative for flushing, nail changes, rash and suspicious lesions.  Musculoskeletal: Negative for arthritis, joint pain, muscle cramps, myalgias, neck pain and stiffness.  Gastrointestinal: Negative for abdominal pain, bowel incontinence, diarrhea and excessive appetite.  Genitourinary: Negative for decreased libido, genital sores and incomplete emptying.  Neurological: Negative for brief paralysis, focal weakness, headaches and loss of balance.  Psychiatric/Behavioral: Negative for altered mental status, depression and suicidal ideas.  Allergic/Immunologic: Negative for HIV exposure and persistent infections.    EKGs/Labs/Other Studies Reviewed:    The following studies were reviewed today:   EKG: None today  Transthoracic echocardiogram 02/13/2022 IMPRESSIONS     1. Global longitudinal strain is -27.6%. Left ventricular ejection  fraction, by estimation, is 70 to 75%. The left ventricle has hyperdynamic  function. The left ventricle has no regional wall motion abnormalities.  There is mild left ventricular  hypertrophy. Left ventricular diastolic parameters were normal.   2. Right ventricular systolic function is normal. The right ventricular  size is normal.   3. Left atrial size was mildly dilated.   4. The mitral valve is normal in structure. Trivial mitral valve  regurgitation.   5. The aortic valve is normal in structure. Aortic valve regurgitation is  not visualized.   6. The inferior vena cava is normal in size with greater than 50%  respiratory variability, suggesting right atrial pressure of 3 mmHg.   FINDINGS   Left Ventricle: Global longitudinal strain is -27.6%. Left ventricular  ejection fraction, by estimation, is 70 to 75%. The left ventricle has  hyperdynamic function. The left ventricle has no regional wall motion  abnormalities. The left ventricular   internal cavity size was normal in size. There is mild left ventricular  hypertrophy. Left ventricular diastolic parameters were normal.   Right Ventricle: The right ventricular size is normal. Right vetricular  wall thickness was not assessed. Right ventricular systolic function is  normal.   Left Atrium: Left atrial size was mildly dilated.   Right Atrium: Right atrial size was normal in size.   Pericardium: There is no evidence of pericardial effusion.   Mitral Valve: The mitral valve is normal in structure. Trivial mitral  valve regurgitation.   Tricuspid Valve: The tricuspid valve is normal in structure. Tricuspid  valve regurgitation is trivial.   Aortic Valve: The aortic valve is normal in structure. Aortic valve  regurgitation is not visualized.   Pulmonic Valve: The pulmonic valve was normal in structure. Pulmonic valve  regurgitation is trivial.   Aorta: The aortic root is normal in size and structure.   Venous: The inferior vena cava is normal in size with greater than 50%  respiratory variability, suggesting right atrial pressure of 3 mmHg.   IAS/Shunts: No atrial level shunt  detected by color flow Doppler.       Recent Labs: 07/20/2021: B Natriuretic Peptide 96.1 07/21/2021: ALT 16 09/12/2021: Hemoglobin 8.7; Platelets 465 01/28/2022: BUN 15; Creatinine, Ser 0.90; Magnesium 2.1; Potassium 4.8; Sodium 137  Recent Lipid Panel No results found for: "CHOL", "TRIG", "HDL", "CHOLHDL", "VLDL", "LDLCALC", "LDLDIRECT"  Physical Exam:    VS:  BP (!) 152/94 (BP Location: Right Arm, Patient Position: Sitting)   Pulse 77   Ht 5' 3.5" (1.613 m)   Wt 167 lb 6.4 oz (75.9 kg)   SpO2 99%   BMI 29.19 kg/m     Wt Readings from Last 3 Encounters:  06/27/22 167 lb 6.4 oz (75.9 kg)  01/28/22 172 lb 9.6 oz (78.3 kg)  01/06/22 168 lb 9.6 oz (76.5 kg)     GEN: Well nourished, well developed in no acute distress HEENT: Normal NECK: No JVD; No carotid bruits LYMPHATICS:  No lymphadenopathy CARDIAC: S1S2 noted,RRR, no murmurs, rubs, gallops RESPIRATORY:  Clear to auscultation without rales, wheezing or rhonchi  ABDOMEN: Soft, non-tender, non-distended, +bowel sounds, no guarding. EXTREMITIES: No edema, No cyanosis, no clubbing MUSCULOSKELETAL:  No deformity  SKIN: Warm and dry NEUROLOGIC:  Alert and oriented x 3, non-focal PSYCHIATRIC:  Normal affect, good insight  ASSESSMENT:    1. Primary hypertension   2. Tobacco use   3. Overweight (BMI 25.0-29.9)     PLAN:     Her Aldactone was stopped due to a body rash, I am going to increase amlodipine to 10 mg daily, add hydrochlorothiazide 12.5 mg daily.  Continue the valsartan.  She will take her blood pressure and send me information for 2 weeks.  Our target blood pressure is less than 130/80 mmHg.  Smoking cessation advised.  The patient is in agreement with the above plan. The patient left the office in stable condition.  The patient will follow up in 16 weeks.   Medication Adjustments/Labs and Tests Ordered: Current medicines are reviewed at length with the patient today.  Concerns regarding medicines are outlined above.  No orders of the defined types were placed in this encounter.  Meds ordered this encounter  Medications   amLODipine (NORVASC) 10 MG tablet    Sig: Take 1 tablet (10 mg total) by mouth daily.    Dispense:  90 tablet    Refill:  3   hydrochlorothiazide (MICROZIDE) 12.5 MG capsule    Sig: Take 1 capsule (12.5 mg total) by mouth daily.    Dispense:  90 capsule    Refill:  3    Patient Instructions  Medication Instructions:  Your physician has recommended you make the following change in your medication:  INCREASE: Amlodipine 10mg  daily. START: Hydrochlorothiazide (HCTZ) 12.5mg  daily.   *If you need a refill on your cardiac medications before your next appointment, please call your pharmacy*  Please take your blood pressure daily for 2 weeks and send in a MyChart  message. Please include heart rates.   HOW TO TAKE YOUR BLOOD PRESSURE: Rest 5 minutes before taking your blood pressure. Don't smoke or drink caffeinated beverages for at least 30 minutes before. Take your blood pressure before (not after) you eat. Sit comfortably with your back supported and both feet on the floor (don't cross your legs). Elevate your arm to heart level on a table or a desk. Use the proper sized cuff. It should fit smoothly and snugly around your bare upper arm. There should be enough room to slip a fingertip under the cuff. The  bottom edge of the cuff should be 1 inch above the crease of the elbow. Ideally, take 3 measurements at one sitting and record the average.    Lab Work: NONE If you have labs (blood work) drawn today and your tests are completely normal, you will receive your results only by: MyChart Message (if you have MyChart) OR A paper copy in the mail If you have any lab test that is abnormal or we need to change your treatment, we will call you to review the results.   Testing/Procedures: NONE   Follow-Up: At [facility], you and your health needs are our priority.  As part of our continuing mission to provide you with exceptional heart care, we have created designated Provider Care Teams.  These Care Teams include your primary Cardiologist (physician) and Advanced Practice Providers (APPs -  Physician Assistants and Nurse Practitioners) who all work together to provide you with the care you need, when you need it.  We recommend signing up for the patient portal called "MyChart".  Sign up information is provided on this After Visit Summary.  MyChart is used to connect with patients for Virtual Visits (Telemedicine).  Patients are able to view lab/test results, encounter notes, upcoming appointments, etc.  Non-urgent messages can be sent to your provider as well.   To learn more about what you can do with MyChart, go to [email or web address].     Your next appointment:   4 month(s)  The format for your next appointment:   In Person  Provider:   Thomasene RippleKardie Robie Mcniel, DO      Adopting a Healthy Lifestyle.  Know what a healthy weight is for you (roughly BMI <25) and aim to maintain this   Aim for 7+ servings of fruits and vegetables daily   65-80+ fluid ounces of water or unsweet tea for healthy kidneys   Limit to max 1 drink of alcohol per day; avoid smoking/tobacco   Limit animal fats in diet for cholesterol and heart health - choose grass fed whenever available   Avoid highly processed foods, and foods high in saturated/trans fats   Aim for low stress - take time to unwind and care for your mental health   Aim for 150 min of moderate intensity exercise weekly for heart health, and weights twice weekly for bone health   Aim for 7-9 hours of sleep daily   When it comes to diets, agreement about the perfect plan isnt easy to find, even among the experts. Experts at the [employer] developed an idea known as the Healthy Eating Plate. Just imagine a plate divided into logical, healthy portions.   The emphasis is on diet quality:   Load up on vegetables and fruits - one-half of your plate: Aim for color and variety, and remember that potatoes dont count.   Go for whole grains - one-quarter of your plate: Whole wheat, barley, wheat berries, quinoa, oats, brown rice, and foods made with them. If you want pasta, go with whole wheat pasta.   Protein power - one-quarter of your plate: Fish, chicken, beans, and nuts are all healthy, versatile protein sources. Limit red meat.   The diet, however, does go beyond the plate, offering a few other suggestions.   Use healthy plant oils, such as olive, canola, soy, corn, sunflower and peanut. Check the labels, and avoid partially hydrogenated oil, which have unhealthy trans fats.   If youre thirsty, drink water. Coffee and tea are good in  moderation, but skip sugary  drinks and limit milk and dairy products to one or two daily servings.   The type of carbohydrate in the diet is more important than the amount. Some sources of carbohydrates, such as vegetables, fruits, whole grains, and beans-are healthier than others.   Finally, stay active  Signed, Thomasene Ripple, DO  06/27/2022 9:46 AM    [facility]

## 2022-10-27 ENCOUNTER — Encounter: Payer: Self-pay | Admitting: Family Medicine

## 2022-10-28 ENCOUNTER — Ambulatory Visit: Payer: Self-pay | Attending: Cardiology | Admitting: Cardiology

## 2022-11-09 NOTE — Progress Notes (Deleted)
Cardiology Clinic Note   Patient Name: [name] [name] Date of Encounter: 11/09/2022  Primary Care Provider:  Dorna Mai, MD Primary Cardiologist:  Berniece Salines, DO  Patient Profile    [name] [name] 42 year old female presents to the clinic today for follow-up evaluation of her hypertension.  Past Medical History    Past Medical History:  Diagnosis Date   Hypertension    Menorrhagia 06/16/2017   Past Surgical History:  Procedure Laterality Date   CESAREAN SECTION      Allergies  Allergies  Allergen Reactions   Latex     History of Present Illness    [name] [name] has a PMH of HTN, tobacco use, iron deficiency anemia, and BMI of 25-29.9.  She was hospitalized 11/22 with hypertensive emergency.  When she was initially seen and evaluated by Dr. Harriet Masson; she reported that she had taken amlodipine, HCTZ, and labetalol for her blood pressure in the past.  She indicated that she had also been on metoprolol which was discontinued.  She was seen in follow-up on 01/06/2022.  Her blood pressure was noted to be 240/120.  She was given a dose of clonidine.  She was monitored for 2 hours.  During that time she was transitioned off of lisinopril to valsartan.  She was continued on Aldactone.  She was seen in follow-up 01/28/2022.  During that time her blood pressure continued to be elevated.  Amlodipine 5 mg was added to her medication regimen.  Her Aldactone had been stopped and her valsartan was increased.  Her blood pressure was noted to be 152/94.  Her amlodipine was increased to 10 mg daily and HCTZ 12.5 mg was added to her medication regimen.  She presents to the clinic today for follow-up evaluation and states***.  *** denies chest pain, shortness of breath, lower extremity edema, fatigue, palpitations, melena, hematuria, hemoptysis, diaphoresis, weakness, presyncope, syncope, orthopnea, and PND.  Essential hypertension-BP today***.  Reports compliance with medical  therapy. Continue amlodipine, HCTZ, valsartan Heart healthy low-sodium diet-salty 6 given Increase physical activity as tolerated Maintain blood pressure log Secondary causes of hypertension reviewed Repeat BMP  Tobacco use-smoking cessation strongly encouraged. Smoking cessation information given   Disposition: Follow-up with Dr. Harriet Masson or me in 3-4 months.  Home Medications    Prior to Admission medications   Medication Sig Start Date End Date Taking? Authorizing Provider  amLODipine (NORVASC) 10 MG tablet Take 1 tablet (10 mg total) by mouth daily. 06/27/22   Tobb, Kardie, DO  hydrochlorothiazide (MICROZIDE) 12.5 MG capsule Take 1 capsule (12.5 mg total) by mouth daily. 06/27/22   Tobb, Kardie, DO  valsartan (DIOVAN) 320 MG tablet Take 1 tablet (320 mg total) by mouth daily. 02/14/22   Tobb, Kardie, DO  FLUoxetine (PROZAC) 10 MG tablet Take 10 mg by mouth daily.  05/05/20  [provider]  metoprolol succinate (TOPROL-XL) 50 MG 24 hr tablet Take 1 tablet (50 mg total) by mouth daily. Take with or immediately following a meal. 08/23/16 05/05/20  Quintella Reichert, MD    Family History    Family History  Problem Relation Age of Onset   Hypertension Mother    Diabetes Mother    Diabetes Father    She indicated that her mother is alive. She indicated that her father is alive.  Social History    Social History   Socioeconomic History   Marital status: Single    Spouse name: Not on file   Number of children: Not on file  Years of education: Not on file   Highest education level: Not on file  Occupational History   Not on file  Tobacco Use   Smoking status: Every Day    Packs/day: 0.10    Types: Cigarettes   Smokeless tobacco: Never  Substance and Sexual Activity   Alcohol use: Yes    Comment: 3/week   Drug use: No   Sexual activity: Never  Other Topics Concern   Not on file  Social History Narrative   Not on file   Social Determinants of Health    Financial Resource Strain: Low Risk  (01/07/2022)   Overall Financial Resource Strain (CARDIA)    Difficulty of Paying Living Expenses: Not very hard  Food Insecurity: No Food Insecurity (01/07/2022)   Hunger Vital Sign    Worried About Running Out of Food in the Last Year: Never true    Ran Out of Food in the Last Year: Never true  Transportation Needs: No Transportation Needs (01/07/2022)   PRAPARE - Hydrologist (Medical): No    Lack of Transportation (Non-Medical): No  Physical Activity: Not on file  Stress: Not on file  Social Connections: Not on file  Intimate Partner Violence: Not on file     Review of Systems    General:  No chills, fever, night sweats or weight changes.  Cardiovascular:  No chest pain, dyspnea on exertion, edema, orthopnea, palpitations, paroxysmal nocturnal dyspnea. Dermatological: No rash, lesions/masses Respiratory: No cough, dyspnea Urologic: No hematuria, dysuria Abdominal:   No nausea, vomiting, diarrhea, bright red blood per rectum, melena, or hematemesis Neurologic:  No visual changes, wkns, changes in mental status. All other systems reviewed and are otherwise negative except as noted above.  Physical Exam    VS:  There were no vitals taken for this visit. , BMI There is no height or weight on file to calculate BMI. GEN: Well nourished, well developed, in no acute distress. HEENT: normal. Neck: Supple, no JVD, carotid bruits, or masses. Cardiac: RRR, no murmurs, rubs, or gallops. No clubbing, cyanosis, edema.  Radials/DP/PT 2+ and equal bilaterally.  Respiratory:  Respirations regular and unlabored, clear to auscultation bilaterally. GI: Soft, nontender, nondistended, BS + x 4. MS: no deformity or atrophy. Skin: warm and dry, no rash. Neuro:  Strength and sensation are intact. Psych: Normal affect.  Accessory Clinical Findings    Recent Labs: 01/28/2022: BUN 15; Creatinine, Ser 0.90; Magnesium 2.1; Potassium 4.8;  Sodium 137   Recent Lipid Panel No results found for: "CHOL", "TRIG", "HDL", "CHOLHDL", "VLDL", "LDLCALC", "LDLDIRECT"  No BP recorded.  {Refresh Note OR Click here to enter BP  :1}***    ECG personally reviewed by me today- *** - No acute changes  Echocardiogram 02/13/2022  IMPRESSIONS     1. Global longitudinal strain is -27.6%. Left ventricular ejection  fraction, by estimation, is 70 to 75%. The left ventricle has hyperdynamic  function. The left ventricle has no regional wall motion abnormalities.  There is mild left ventricular  hypertrophy. Left ventricular diastolic parameters were normal.   2. Right ventricular systolic function is normal. The right ventricular  size is normal.   3. Left atrial size was mildly dilated.   4. The mitral valve is normal in structure. Trivial mitral valve  regurgitation.   5. The aortic valve is normal in structure. Aortic valve regurgitation is  not visualized.   6. The inferior vena cava is normal in size with greater than 50%  respiratory variability, suggesting right atrial pressure of 3 mmHg.   FINDINGS   Left Ventricle: Global longitudinal strain is -27.6%. Left ventricular  ejection fraction, by estimation, is 70 to 75%. The left ventricle has  hyperdynamic function. The left ventricle has no regional wall motion  abnormalities. The left ventricular  internal cavity size was normal in size. There is mild left ventricular  hypertrophy. Left ventricular diastolic parameters were normal.   Right Ventricle: The right ventricular size is normal. Right vetricular  wall thickness was not assessed. Right ventricular systolic function is  normal.   Left Atrium: Left atrial size was mildly dilated.   Right Atrium: Right atrial size was normal in size.   Pericardium: There is no evidence of pericardial effusion.   Mitral Valve: The mitral valve is normal in structure. Trivial mitral  valve regurgitation.   Tricuspid Valve: The  tricuspid valve is normal in structure. Tricuspid  valve regurgitation is trivial.   Aortic Valve: The aortic valve is normal in structure. Aortic valve  regurgitation is not visualized.   Pulmonic Valve: The pulmonic valve was normal in structure. Pulmonic valve  regurgitation is trivial.   Aorta: The aortic root is normal in size and structure.   Venous: The inferior vena cava is normal in size with greater than 50%  respiratory variability, suggesting right atrial pressure of 3 mmHg.   IAS/Shunts: No atrial level shunt detected by color flow Doppler.     Assessment & Plan   1.  ***   Jossie Ng. [place]aka NP-C     11/09/2022, 2:50 PM [facility] [address] Office [phone] Fax [phone]    I spent***minutes examining this patient, reviewing medications, and using patient centered shared decision making involving her cardiac care.  Prior to her visit I spent greater than 20 minutes reviewing her past medical history,  medications, and prior cardiac tests.

## 2022-11-11 ENCOUNTER — Ambulatory Visit: Payer: Self-pay | Attending: Cardiology | Admitting: General Practice

## 2023-03-01 ENCOUNTER — Other Ambulatory Visit: Payer: Self-pay | Admitting: Cardiology

## 2023-05-28 ENCOUNTER — Other Ambulatory Visit: Payer: Self-pay | Admitting: Cardiology

## 2023-07-07 ENCOUNTER — Other Ambulatory Visit: Payer: Self-pay | Admitting: Cardiology

## 2023-08-04 ENCOUNTER — Other Ambulatory Visit: Payer: Self-pay | Admitting: Cardiology

## 2023-08-19 ENCOUNTER — Other Ambulatory Visit: Payer: Self-pay | Admitting: Cardiology

## 2023-08-21 ENCOUNTER — Other Ambulatory Visit: Payer: Self-pay | Admitting: Cardiology

## 2023-08-21 MED ORDER — VALSARTAN 320 MG PO TABS: 320.0000 mg | ORAL_TABLET | Freq: Every day | ORAL | 0 refills | Status: DC

## 2023-08-21 MED ORDER — AMLODIPINE BESYLATE 10 MG PO TABS: 10.0000 mg | ORAL_TABLET | Freq: Every day | ORAL | 0 refills | Status: DC

## 2023-08-21 NOTE — Telephone Encounter (Signed)
Refill sent to [facility] pharmacy for Amlodipine and Valsartan. Patient scheduled for appt with Dr Servando Salina 11/06/23 at 8:20 am.

## 2023-09-02 ENCOUNTER — Other Ambulatory Visit: Payer: Self-pay | Admitting: Cardiology

## 2023-11-04 NOTE — Progress Notes (Signed)
 Dr. Servando Salina and Leavy Cella,   [name] [name] presents with elevated blood pressure at her visit, no change in BMI or smoking status, please refer her to [id number] or Care Navigation for health coaching as she may benefit from the program for either developing healthy eating habits, increasing physical activity or smoking cessation.   [name] Munda, MS, ERHD, [facility]  Care Guide, Health & Wellness Coach [address] [place] [address] Telephone: [phone] Email: [email or web address]

## 2023-11-06 ENCOUNTER — Ambulatory Visit: Payer: Self-pay | Attending: Cardiology | Admitting: Cardiology

## 2023-11-06 ENCOUNTER — Encounter: Payer: Self-pay | Admitting: Cardiology

## 2023-11-06 VITALS — BP 128/74 | HR 79 | Ht 63.0 in | Wt 165.8 lb

## 2023-11-06 DIAGNOSIS — I10 Essential (primary) hypertension: Secondary | ICD-10-CM

## 2023-11-06 MED ORDER — AMLODIPINE BESYLATE-VALSARTAN 10-320 MG PO TABS: 1.0000 | ORAL_TABLET | Freq: Every day | ORAL | 3 refills | Status: AC

## 2023-11-06 MED ORDER — HYDROCHLOROTHIAZIDE 12.5 MG PO CAPS: 12.5000 mg | ORAL_CAPSULE | ORAL | 3 refills | Status: AC

## 2023-11-06 NOTE — Patient Instructions (Addendum)
 Medication Instructions:  Your physician has recommended you make the following change in your medication:  CHANGE: Hydrochlorothiazide 12.5 mg once weekly CHANGE: Exforge (Amlodipine 10 mg, Valsartan 320 combo pill) *If you need a refill on your cardiac medications before your next appointment, please call your pharmacy*  Follow-Up: At [facility], you and your health needs are our priority.  As part of our continuing mission to provide you with exceptional heart care, we have created designated Provider Care Teams.  These Care Teams include your primary Cardiologist (physician) and Advanced Practice Providers (APPs -  Physician Assistants and Nurse Practitioners) who all work together to provide you with the care you need, when you need it.  Your next appointment:   1 year  Provider:   Thomasene Ripple, DO     Other Instructions Low-Sodium Eating Plan Salt (sodium) helps you keep a healthy balance of fluids in your body. Too much sodium can raise your blood pressure. It can also cause fluid and waste to be held in your body. Your health care provider or dietitian may recommend a low-sodium eating plan if you have high blood pressure (hypertension), kidney disease, liver disease, or heart failure. Eating less sodium can help lower your blood pressure and reduce swelling. It can also protect your heart, liver, and kidneys. What are tips for following this plan? Reading food labels  Check food labels for the amount of sodium per serving. If you eat more than one serving, you must multiply the listed amount by the number of servings. Choose foods with less than 140 milligrams (mg) of sodium per serving. Avoid foods with 300 mg of sodium or more per serving. Always check how much sodium is in a product, even if the label says "unsalted" or "no salt added." Shopping  Buy products labeled as "low-sodium" or "no salt added." Buy fresh foods. Avoid canned foods and pre-made or frozen  meals. Avoid canned, cured, or processed meats. Buy breads that have less than 80 mg of sodium per slice. Cooking  Eat more home-cooked food. Try to eat less restaurant, buffet, and fast food. Try not to add salt when you cook. Use salt-free seasonings or herbs instead of table salt or sea salt. Check with your provider or pharmacist before using salt substitutes. Cook with plant-based oils, such as canola, sunflower, or olive oil. Meal planning When eating at a restaurant, ask if your food can be made with less salt or no salt. Avoid dishes labeled as brined, pickled, cured, or smoked. Avoid dishes made with soy sauce, miso, or teriyaki sauce. Avoid foods that have monosodium glutamate (MSG) in them. MSG may be added to some restaurant food, sauces, soups, bouillon, and canned foods. Make meals that can be grilled, baked, poached, roasted, or steamed. These are often made with less sodium. General information Try to limit your sodium intake to 1,500-2,300 mg each day, or the amount told by your provider. What foods should I eat? Fruits Fresh, frozen, or canned fruit. Fruit juice. Vegetables Fresh or frozen vegetables. "No salt added" canned vegetables. "No salt added" tomato sauce and paste. Low-sodium or reduced-sodium tomato and vegetable juice. Grains Low-sodium cereals, such as oats, puffed wheat and rice, and shredded wheat. Low-sodium crackers. Unsalted rice. Unsalted pasta. Low-sodium bread. Whole grain breads and whole grain pasta. Meats and other proteins Fresh or frozen meat, poultry, seafood, and fish. These should have no added salt. Low-sodium canned tuna and salmon. Unsalted nuts. Dried peas, beans, and lentils without added  salt. Unsalted canned beans. Eggs. Unsalted nut butters. Dairy Milk. Soy milk. Cheese that is naturally low in sodium, such as ricotta cheese, fresh mozzarella, or Swiss cheese. Low-sodium or reduced-sodium cheese. Cream cheese. Yogurt. Seasonings and  condiments Fresh and dried herbs and spices. Salt-free seasonings. Low-sodium mustard and ketchup. Sodium-free salad dressing. Sodium-free light mayonnaise. Fresh or refrigerated horseradish. Lemon juice. Vinegar. Other foods Homemade, reduced-sodium, or low-sodium soups. Unsalted popcorn and pretzels. Low-salt or salt-free chips. The items listed above may not be all the foods and drinks you can have. Talk to a dietitian to learn more. What foods should I avoid? Vegetables Sauerkraut, pickled vegetables, and relishes. Olives. [place] fries. Onion rings. Regular canned vegetables, except low-sodium or reduced-sodium items. Regular canned tomato sauce and paste. Regular tomato and vegetable juice. Frozen vegetables in sauces. Grains Instant hot cereals. Bread stuffing, pancake, and biscuit mixes. Croutons. Seasoned rice or pasta mixes. Noodle soup cups. Boxed or frozen macaroni and cheese. Regular salted crackers. Self-rising flour. Meats and other proteins Meat or fish that is salted, canned, smoked, spiced, or pickled. Precooked or cured meat, such as sausages or meat loaves. [name]. Ham. Pepperoni. Hot dogs. Corned beef. Chipped beef. Salt pork. Jerky. Pickled herring, anchovies, and sardines. Regular canned tuna. Salted nuts. Dairy Processed cheese and cheese spreads. Hard cheeses. Cheese curds. Blue cheese. Feta cheese. String cheese. Regular cottage cheese. Buttermilk. Canned milk. Fats and oils Salted butter. Regular margarine. Ghee. Bacon fat. Seasonings and condiments Onion salt, garlic salt, seasoned salt, table salt, and sea salt. Canned and packaged gravies. Worcestershire sauce. Tartar sauce. Barbecue sauce. Teriyaki sauce. Soy sauce, including reduced-sodium soy sauce. Steak sauce. Fish sauce. Oyster sauce. Cocktail sauce. Horseradish that you find on the shelf. Regular ketchup and mustard. Meat flavorings and tenderizers. Bouillon cubes. Hot sauce. Pre-made or packaged marinades.  Pre-made or packaged taco seasonings. Relishes. Regular salad dressings. Salsa. Other foods Salted popcorn and pretzels. Corn chips and puffs. Potato and tortilla chips. Canned or dried soups. Pizza. Frozen entrees and pot pies. The items listed above may not be all the foods and drinks you should avoid. Talk to a dietitian to learn more. This information is not intended to replace advice given to you by your health care provider. Make sure you discuss any questions you have with your health care provider. Document Revised: 09/04/2022 Document Reviewed: 09/04/2022 Elsevier Patient Education  2024 [employer]       Heart Failure: Eating Plan Heart failure is a long-term condition where the heart can't pump enough blood through the body. When this happens, parts of the body don't get the blood and oxygen they need. Living with heart failure can be hard. But a healthy lifestyle and choosing the right foods may help to improve your symptoms. If you have heart failure, your eating plan will include limiting the amount of salt, also called sodium, and unhealthy fats you eat. What are tips for following this plan? Reading food labels Check food labels for the amount of sodium per serving. Choose foods that have less than 140 mg (milligrams) of sodium in each serving. Check food labels for the number of calories per serving. This is important if you need to limit your daily calorie intake to lose weight. Check food labels for the serving size. If you eat more than one serving, you'll be eating more sodium and calories than what's listed on the label. Look for foods with the words "sodium-free," "very low sodium," or "low sodium" on the package. Foods  labeled as "reduced sodium," "lightly salted," or "no salt added" may still have more sodium than what's recommended for you. Cooking Avoid adding salt when cooking. Before using any salt substitutes, talk with your health care provider or an expert  in healthy eating called a dietitian. Season food with salt-free seasonings, spices, or herbs. Check the label of seasoning mixes to make sure they don't contain salt. Cook with heart-healthy oils, such as olive, canola, soybean, or sunflower oil. Do not fry foods. Cook foods using low-fat methods, like baking, boiling, grilling, and broiling. Limit unhealthy fats when cooking. To do this: Remove the skin from poultry, such as chicken. Remove all the fat you can see on meats. Skim the fat off from stews, soups, and gravies before serving them. Meal planning  Limit your intake of: Processed, canned, or prepackaged foods. Foods that are high in trans fat, such as fried foods. Sweets, desserts, sugary drinks, and other foods with added sugar. Full-fat dairy products, such as whole milk. Eat a balanced diet. This may include: 4-5 servings of fruit each day and 4-5 servings of vegetables each day. At each meal, try to fill one-half of your plate with fruits and vegetables. Up to 6-8 servings of whole grains each day. Up to 2 servings of lean meat, poultry, or fish each day. One serving of meat is equal to 3 oz (85 g). This is about the same size as a deck of cards. 2 servings of low-fat dairy each day. Heart-healthy fats. Healthy fats called omega-3 fatty acids are a good choice. They're found in foods such as flaxseed and cold-water fish like sardines, salmon, and mackerel. Aim to eat 25-35 g (grams) of fiber a day. Foods that are high in fiber include apples, broccoli, carrots, beans, peas, and whole grains. Do not add salt or condiments that contain salt (such as soy sauce) to foods before eating. When eating at a restaurant, ask that your food be prepared with less salt or no salt, if possible. Try to eat 2 or more plant-based or meat-free meals each week. Cook more meals at home and eat less at restaurants, buffets, and fast food places. General information Do not eat more than 2,300 mg of  sodium a day. The amount of sodium that's recommended for you may be lower, depending on your condition. Stay at a healthy weight as told. Ask your provider what a healthy weight is for you. Check your weight every day. Work with your provider and dietitian to make a plan that will help you lose weight or stay at your current weight. Limit how much fluid you drink. Ask your provider or dietitian how much fluid you can have each day. Limit or avoid alcohol as told. Recommended foods Fruits All fresh, frozen, and canned fruits. Dried fruits, such as raisins, prunes, and cranberries. Vegetables All fresh vegetables. Vegetables that are frozen without sauce or added salt. Low-sodium or sodium-free canned vegetables. Grains Bread with less than 80 mg of sodium per slice. Whole-wheat pasta, quinoa, and brown rice. Oats and oatmeal. Barley. Millet. Grits and cream of wheat. Whole-grain and whole-wheat cold cereal. Meats and other protein foods Lean cuts of meat. Skinless chicken and [place]. Fish with high omega-3 fatty acids, such as salmon, sardines, and other cold-water fishes. Eggs. Dried beans, peas, and edamame. Unsalted nuts and nut butters. Dairy Low-fat or nonfat (skim) milk and dried milk. Rice milk, soy milk, and almond milk. Low-fat or nonfat yogurt. Small amounts of reduced-sodium block cheese.  Low-sodium cottage cheese. Fats and oils Olive, canola, soybean, flaxseed, avocado, or sunflower oil. Sweets and desserts Applesauce. Granola bars. Sugar-free pudding and gelatin. Frozen fruit bars. Seasoning and other foods Fresh and dried herbs. Lemon or lime juice. Vinegar. Low-sodium ketchup. Salt-free marinades, salad dressings, sauces, and seasonings. The items listed above may not be all the foods and drinks you can have. Talk with a dietitian to learn more. Foods to avoid Fruits Fruits that are dried with preservatives that contain sodium. Vegetables Canned vegetables. Frozen  vegetables with sauce or seasonings. Creamed vegetables. [place] fries. Onion rings. Pickled vegetables and sauerkraut. Grains Bread with more than 80 mg of sodium per slice. Hot or cold cereal with more than 140 mg sodium per serving. Salted pretzels and crackers. Prepackaged breadcrumbs. Bagels, croissants, and biscuits. Meats and other protein foods Ribs and chicken wings. Bacon, ham, pepperoni, bologna, salami, and packaged luncheon meats. Hot dogs, bratwurst, and sausage. Canned meat. Smoked meat and fish. Salted nuts and seeds. Dairy Whole milk, half-and-half, and cream. Buttermilk. Processed cheese, cheese spreads, and cheese curds. Regular cottage cheese. Feta cheese. Shredded cheese. String cheese. Fats and oils Butter, lard, shortening, ghee, and bacon fat. Canned and packaged gravies. Seasoning and other foods Onion salt, garlic salt, table salt, and sea salt. Marinades. Regular salad dressings. Relishes, pickles, and olives. Meat flavorings and tenderizers, and bouillon cubes. Horseradish, ketchup, and mustard. Worcestershire sauce. Teriyaki sauce, soy sauce (including reduced sodium). Hot sauce and Tabasco sauce. Steak sauce, fish sauce, oyster sauce, and cocktail sauce. Taco seasonings. Barbecue sauce. Tartar sauce. The items listed above may not be all the foods and drinks you should avoid. Talk with a dietitian to learn more. This information is not intended to replace advice given to you by your health care provider. Make sure you discuss any questions you have with your health care provider. Document Revised: 04/13/2023 Document Reviewed: 04/02/2023 Elsevier Patient Education  2024 Elsevier Inc.      Heart Failure: Helping Someone Manage Heart failure is a long-term condition where the heart can't pump enough blood through the body. When this happens, parts of the body don't get the blood and oxygen they need. If you're taking care of a friend or family member with heart failure,  you have an important role to play. As a caregiver, you provide both physical and emotional support. You'll be helping to make sure the person sticks to their schedule, takes their medicines, goes to appointments, and follows their treatment plan. How does heart failure affect the person I'm caring for? The daily routine of a person with heart failure may have many changes. The person may need to: Make diet changes. Make changes to activity and exercise routines. Take medicines on a regular basis. Have procedures or surgery. Go to cardiac rehabilitation. These programs include physical activity. What actions can I take to help someone manage this condition? Planning for discharge from the hospital or care center Before your friend or family member leaves the hospital or care center, make sure you understand: The recovery process after heart failure. This includes any physical, emotional, or behavior changes that may occur. The medicines that the person has to take and when to take them. Whether you need to get any devices or equipment for the person. The treatments for heart failure. How to lower the person's risk of having other heart problems. Diet and exercise changes. What symptoms to watch for and when emergency medical care is needed. How to contact the person's health  care provider when you have questions about the person's care. You may want to appoint one family member or caregiver as the main contact. Providing general care and support Ask the person how you can give them support. Learn as much as you can about heart failure. Help your friend or family member follow their treatment plan. This could include taking notes at health care visits and helping the person keep track of information. Find out what extra help the person may need at home. In some cases, a person with heart failure may need help using the bathroom, bathing, or eating. Make sure you know what changes to make in  the home to keep the person safe. How to care for yourself As a caregiver, taking care of yourself is also important. The goal is to meet the needs of the person with heart failure while also meeting your own needs. Remember that your support really matters. Having social support is very helpful for someone who is coping with illness. Recognizing stress It's normal to have many different emotions while caring for someone with heart failure. The changes that come with this condition can be hard and stressful. Dealing with them can lead to caregiver burnout. This is when you feel exhausted, both physically and mentally. It can happen if you feel overwhelmed. Signs of this include: Sleep problems. Trouble concentrating. Changes in appetite. Feeling depressed or moody. Not being motivated to do anything. Feeling like your emotions are out of control. Not taking care of yourself or the needs of the person with heart failure. This can include forgetting to take or give medicines or keep appointments. Taking steps to care for yourself  Monitor yourself for caregiver burnout. Ask friends and family for help. Get counseling or therapy. Take time for yourself. Relax, exercise, and eat healthy foods. Maintain a sense of self outside of being a caregiver. Get respite care for the person with heart failure. Respite care provides short-term care so that you can have regular breaks from the stress of caregiving. Where to find support  Ask the person's provider for help. They can refer you to helpful services or resources. Think about taking classes to learn more about helping someone with heart failure. Take a class to get certified in CPR. Join a support group for people who are caring for someone with heart failure. Where to find more information American Heart Association: heart.org This information is not intended to replace advice given to you by your health care provider. Make sure you discuss any  questions you have with your health care provider. Document Revised: 04/02/2023 Document Reviewed: 04/02/2023 Elsevier Patient Education  2024 Else[employer] Inc.  Heart Failure: What to Know  Heart failure means that your heart isn't able to pump blood the way it should. The heart might not be able to pump enough blood and oxygen to your body tissues. Heart failure is usually a long-term condition. Be sure to take good care of yourself and follow your treatment plan. Different stages of heart failure have different treatment plans. The stages are: Stage A: At risk for heart failure. You don't have any symptoms, but you're at risk for getting heart failure. Stage B: Pre-heart failure. You don't have any symptoms, but your heart has changes that show heart failure. Stage C: Symptomatic heart failure. You have symptoms of heart failure, and your heart has changed in ways that show heart failure. Stage D: Advanced heart failure. You have symptoms that make it hard to live your  daily life, and you often need to stay in the hospital because of heart failure. What are the causes? Heart failure may be caused by: High blood pressure. Coronary artery disease. This is when cholesterol and fat build up in the arteries. Heart attack. Heart valves that don't open and close properly. Damage to the heart muscle. An infection of the heart muscle. Lung disease. What increases the risk? Getting older. The risk of heart failure goes up as a person ages. Using tobacco or nicotine products. Being overweight. Using alcohol or drugs. Having any of these conditions: Diabetes. Abnormal heart rhythms. Thyroid problems. Chronic kidney disease. Having a family history of heart failure. Having taken medicines that can damage the heart. What are the signs or symptoms? Symptoms of heart failure include: Shortness of breath. This may happen when doing things like climbing stairs. A cough that doesn't go  away. Swelling of the feet, ankles, legs, or belly. This is called edema. Losing or gaining weight for no reason. Trouble breathing when lying flat. A fast heartbeat. Other symptoms may include: Feeling tired and not having energy. Feeling dizzy or light-headed. You may feel like you're going to faint. Not wanting to eat as much as normal. Feeling like you may vomit. Feeling confused. How is this diagnosed? Heart failure may be diagnosed based on: Your symptoms and medical history. A physical exam. Blood tests. Other tests. These may include: Chest X-ray. Electrocardiogram (ECG). Echocardiogram. Cardiac MRI. Cardiac catheterization and angiogram. How is this treated? Heart failure may be treated with: Medicines. These can be given to: Treat blood pressure, lower heart rates, or make the heart muscle pump stronger. Cause the kidneys to remove extra salt and water from the blood through your pee. Changes in your daily life. These may include: Eating a healthy diet. Staying at a healthy weight. Quitting tobacco or drug use. Limiting or avoiding alcohol. Getting regular exercise. Taking part in a cardiac rehab program. This program helps you improve your health through exercise, education, and counseling. Surgery. Surgery can be done to: Open blocked arteries. Repair valves. Put a device in the heart. This might be a pacemaker, a device to treat abnormal heart rhythms, or a device to help the heart pump better. A heart transplant. This means getting a healthy heart from a donor. This is done when other treatments have not helped. Follow these instructions at home: Treat other conditions as told by your health care provider. These may include high blood pressure or lung disease. Learn as much as you can about heart failure. Keep all follow-up visits. Your provider will want to check on your condition. Where to find more information American Heart Association: heart.org Centers  for Disease Control and Prevention: [employer] This information is not intended to replace advice given to you by your health care provider. Make sure you discuss any questions you have with your health care provider. Document Revised: 06/30/2023 Document Reviewed: 01/19/2023 Elsevier Patient Education  2024 [employer].

## 2023-11-06 NOTE — Progress Notes (Signed)
 Cardiology Office Note:    Date:  11/06/2023   ID:  [name] [name], DOB April 02, 1981, MRN [id number]  PCP:  Georganna Skeans, MD  Cardiologist:  Thomasene Ripple, DO  Electrophysiologist:  None   Referring MD: Georganna Skeans, MD   " I am doing fine"  History of Present Illness:    [name] [name] is a 43 y.o. female with a hx of of hypertension currently on lisinopril 20 mg daily and Aldactone 25 mg daily, iron deficiency anemia, smoker here today to establish cardiac care.   At her last visit in October 2023 we adjusted her antihypertensive medications.  She is currently amlodipine 10 mg daily, hydrochlorothiazide 12.5 and valsartan 320 mg daily.  She has been doing well since her last visit. She cut back on the use of hydrochlorothiazide.    Past Medical History:  Diagnosis Date   Hypertension    Menorrhagia 06/16/2017    Past Surgical History:  Procedure Laterality Date   CESAREAN SECTION      Current Medications: Current Meds  Medication Sig   amLODipine (NORVASC) 10 MG tablet Take 1 tablet (10 mg total) by mouth daily. PLEASE SCHEDULE AN APPOINTMENT TO CONTINUE FUTURE REFILLS 1ST ATTEMPT   hydrochlorothiazide (MICROZIDE) 12.5 MG capsule TAKE 1 CAPSULE BY MOUTH EVERY DAY   valsartan (DIOVAN) 320 MG tablet TAKE 1 TABLET BY MOUTH EVERY DAY   Current Facility-Administered Medications for the 11/06/23 encounter (Office Visit) with Thomasene Ripple, DO  Medication   cloNIDine (CATAPRES) tablet 0.1 mg     Allergies:   Latex   Social History   Socioeconomic History   Marital status: Single    Spouse name: Not on file   Number of children: Not on file   Years of education: Not on file   Highest education level: Not on file  Occupational History   Not on file  Tobacco Use   Smoking status: Some Days    Current packs/day: 0.10    Types: Cigarettes   Smokeless tobacco: Never  Substance and Sexual Activity   Alcohol use: Yes    Comment: 3/week   Drug use: No   Sexual activity:  Never  Other Topics Concern   Not on file  Social History Narrative   Not on file   Social Drivers of Health   Financial Resource Strain: Low Risk  (01/07/2022)   Overall Financial Resource Strain (CARDIA)    Difficulty of Paying Living Expenses: Not very hard  Food Insecurity: No Food Insecurity (01/07/2022)   Hunger Vital Sign    Worried About Running Out of Food in the Last Year: Never true    Ran Out of Food in the Last Year: Never true  Transportation Needs: No Transportation Needs (01/07/2022)   PRAPARE - Administrator, Civil Service (Medical): No    Lack of Transportation (Non-Medical): No  Physical Activity: Not on file  Stress: Not on file  Social Connections: Not on file     Family History: The patient's family history includes Diabetes in her father and mother; Hypertension in her mother.  ROS:   Review of Systems  Constitution: Negative for decreased appetite, fever and weight gain.  HENT: Negative for congestion, ear discharge, hoarse voice and sore throat.   Eyes: Negative for discharge, redness, vision loss in right eye and visual halos.  Cardiovascular: Negative for chest pain, dyspnea on exertion, leg swelling, orthopnea and palpitations.  Respiratory: Negative for cough, hemoptysis, shortness of breath and snoring.   Endocrine:  Negative for heat intolerance and polyphagia.  Hematologic/Lymphatic: Negative for bleeding problem. Does not bruise/bleed easily.  Skin: Negative for flushing, nail changes, rash and suspicious lesions.  Musculoskeletal: Negative for arthritis, joint pain, muscle cramps, myalgias, neck pain and stiffness.  Gastrointestinal: Negative for abdominal pain, bowel incontinence, diarrhea and excessive appetite.  Genitourinary: Negative for decreased libido, genital sores and incomplete emptying.  Neurological: Negative for brief paralysis, focal weakness, headaches and loss of balance.  Psychiatric/Behavioral: Negative for altered  mental status, depression and suicidal ideas.  Allergic/Immunologic: Negative for HIV exposure and persistent infections.    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 Global longitudinal strain is -27.6%. Left ventricular ejection  fraction, by estimation, is 70 to 75%. The left ventricle has hyperdynamic  function. The left ventricle has no regional wall motion abnormalities.  There is mild left ventricular  hypertrophy. Left ventricular diastolic parameters were normal.   2. Right ventricular systolic function is normal. The right ventricular  size is normal.   3. Left atrial size was mildly dilated.   4. The mitral valve is normal in structure. Trivial mitral valve  regurgitation.   5. The aortic valve is normal in structure. Aortic valve regurgitation is  not visualized.   6. The inferior vena cava is normal in size with greater than 50%  respiratory variability, suggesting right atrial pressure of 3 mmHg.   FINDINGS   Left Ventricle: Global longitudinal strain is -27.6%. Left ventricular  ejection fraction, by estimation, is 70 to 75%. The left ventricle has  hyperdynamic function. The left ventricle has no regional wall motion  abnormalities. The left ventricular  internal cavity size was normal in size. There is mild left ventricular  hypertrophy. Left ventricular diastolic parameters were normal.   Right Ventricle: The right ventricular size is normal. Right vetricular  wall thickness was not assessed. Right ventricular systolic function is  normal.   Left Atrium: Left atrial size was mildly dilated.   Right Atrium: Right atrial size was normal in size.   Pericardium: There is no evidence of pericardial effusion.   Mitral Valve: The mitral valve is normal in structure. Trivial mitral  valve regurgitation.    Tricuspid Valve: The tricuspid valve is normal in structure. Tricuspid  valve regurgitation is trivial.   Aortic Valve: The aortic valve is normal in structure. Aortic valve  regurgitation is not visualized.   Pulmonic Valve: The pulmonic valve was normal in structure. Pulmonic valve  regurgitation is trivial.   Aorta: The aortic root is normal in size and structure.   Venous: The inferior vena cava is normal in size with greater than 50%  respiratory variability, suggesting right atrial pressure of 3 mmHg.   IAS/Shunts: No atrial level shunt detected by color flow Doppler.       Recent Labs: No results found for requested labs within last 365 days.  Recent Lipid Panel No results found for: "CHOL", "TRIG", "HDL", "CHOLHDL", "VLDL", "LDLCALC", "LDLDIRECT"  Physical Exam:    VS:  BP 122/86 (BP Location: Right Arm, Patient Position: Sitting, Cuff Size: Normal)   Pulse 79   Ht 5\' 3"  (1.6 m)   Wt 165 lb 12.8 oz (75.2 kg)   SpO2 99%   BMI 29.37 kg/m     Wt Readings from Last 3 Encounters:  11/06/23 165 lb 12.8 oz (75.2  kg)  06/27/22 167 lb 6.4 oz (75.9 kg)  01/28/22 172 lb 9.6 oz (78.3 kg)     GEN: Well nourished, well developed in no acute distress HEENT: Normal NECK: No JVD; No carotid bruits LYMPHATICS: No lymphadenopathy CARDIAC: S1S2 noted,RRR, no murmurs, rubs, gallops RESPIRATORY:  Clear to auscultation without rales, wheezing or rhonchi  ABDOMEN: Soft, non-tender, non-distended, +bowel sounds, no guarding. EXTREMITIES: No edema, No cyanosis, no clubbing MUSCULOSKELETAL:  No deformity  SKIN: Warm and dry NEUROLOGIC:  Alert and oriented x 3, non-focal PSYCHIATRIC:  Normal affect, good insight  ASSESSMENT:    1. Primary hypertension     PLAN:     Blood pressure at target in the office today. Continue Amlodipine-Valsartan daily. She will take hydrochlorothiazide once weekly.   Smoking cessation advised.  The patient is in agreement with the above  plan. The patient left the office in stable condition.  The patient will follow up in 16 weeks.   Medication Adjustments/Labs and Tests Ordered: Current medicines are reviewed at length with the patient today.  Concerns regarding medicines are outlined above.  Orders Placed This Encounter  Procedures   EKG 12-Lead   No orders of the defined types were placed in this encounter.   There are no Patient Instructions on file for this visit.   Adopting a Healthy Lifestyle.  Know what a healthy weight is for you (roughly BMI <25) and aim to maintain this   Aim for 7+ servings of fruits and vegetables daily   65-80+ fluid ounces of water or unsweet tea for healthy kidneys   Limit to max 1 drink of alcohol per day; avoid smoking/tobacco   Limit animal fats in diet for cholesterol and heart health - choose grass fed whenever available   Avoid highly processed foods, and foods high in saturated/trans fats   Aim for low stress - take time to unwind and care for your mental health   Aim for 150 min of moderate intensity exercise weekly for heart health, and weights twice weekly for bone health   Aim for 7-9 hours of sleep daily   When it comes to diets, agreement about the perfect plan isnt easy to find, even among the experts. Experts at the [facility] [employer] developed an idea known as the Healthy Eating Plate. Just imagine a plate divided into logical, healthy portions.   The emphasis is on diet quality:   Load up on vegetables and fruits - one-half of your plate: Aim for color and variety, and remember that potatoes dont count.   Go for whole grains - one-quarter of your plate: Whole wheat, barley, wheat berries, quinoa, oats, brown rice, and foods made with them. If you want pasta, go with whole wheat pasta.   Protein power - one-quarter of your plate: Fish, chicken, beans, and nuts are all healthy, versatile protein sources. Limit red meat.   The diet, however,  does go beyond the plate, offering a few other suggestions.   Use healthy plant oils, such as olive, canola, soy, corn, sunflower and peanut. Check the labels, and avoid partially hydrogenated oil, which have unhealthy trans fats.   If youre thirsty, drink water. Coffee and tea are good in moderation, but skip sugary drinks and limit milk and dairy products to one or two daily servings.   The type of carbohydrate in the diet is more important than the amount. Some sources of carbohydrates, such as vegetables, fruits, whole grains, and beans-are healthier than others.  Finally, stay active  Signed, Thomasene Ripple, DO  11/06/2023 8:56 AM    [facility]

## 2023-11-18 ENCOUNTER — Telehealth: Payer: Self-pay | Admitting: Licensed Clinical Social Worker

## 2023-11-19 NOTE — Telephone Encounter (Signed)
 H&V Care Navigation CSW Progress Note  Clinical Social Worker contacted patient by phone to f/u after appt as pt currently listed as self pt. Was able to reach her at [phone]. Introduced self, role, reason for call. Shared I had previously tried to engage pt for assistance in 2023. She confirms she is still uninsured, did not apply for Medicaid or any other programs. Pt shares that she has moved and I updated her address on chart to [address], [address], [place], [place], [address]. Discussed that she may be eligible for Medicaid, pt does not want me to send referral but rather asks for information about satellite office at [facility] be sent ot her home address. We discussed that she may be eligible for [facility] Financial Assistance if not eligible for Medicaid. No issues shared with medication affordability, prefers her pharmacy be changed to [facility] [place]. I have added that to chart. Pt also denies any issues with housing, utilities, transportation or food costs. Will f/u to ensure she has received resources and answer any additional questions.   Patient is participating in a Managed Medicaid Plan:  No self pay only  SDOH Screenings   Food Insecurity: No Food Insecurity (11/19/2023)  Housing: Low Risk  (11/19/2023)  Transportation Needs: No Transportation Needs (11/19/2023)  Utilities: Not At Risk (11/19/2023)  Depression (PHQ2-9): Low Risk  (09/12/2021)  Financial Resource Strain: Low Risk  (11/19/2023)  Tobacco Use: High Risk (11/06/2023)  Health Literacy: Adequate Health Literacy (11/19/2023)    Octavio Graves, MSW, LCSW Clinical Social Worker II [facility]/Vascular Care Navigation  [phone]- work cell phone (preferred) [phone]- desk phone

## 2023-11-20 ENCOUNTER — Other Ambulatory Visit: Payer: Self-pay | Admitting: Cardiology

## 2023-12-02 ENCOUNTER — Telehealth: Payer: Self-pay | Admitting: Licensed Clinical Social Worker

## 2023-12-02 NOTE — Telephone Encounter (Signed)
 H&V Care Navigation CSW Progress Note  Clinical Social Worker contacted patient by phone to f/u on resources for Medicaid and [employer] as pt is self-pay and on several medications. Was able to reach pt today at [phone]. She shares that she needs to call me back. Remain available should pt return my call/have any additional questions/concerns.  Patient is participating in a Managed Medicaid Plan:  No, self pay only  SDOH Screenings   Food Insecurity: No Food Insecurity (11/19/2023)  Housing: Low Risk  (11/19/2023)  Transportation Needs: No Transportation Needs (11/19/2023)  Utilities: Not At Risk (11/19/2023)  Depression (PHQ2-9): Low Risk  (09/12/2021)  Financial Resource Strain: Low Risk  (11/19/2023)  Tobacco Use: High Risk (11/06/2023)  Health Literacy: Adequate Health Literacy (11/19/2023)    Octavio Graves, MSW, LCSW Clinical Social Worker II [facility] Heart/Vascular Care Navigation  [phone]- work cell phone (preferred) [phone]- desk phone

## 2024-04-06 ENCOUNTER — Ambulatory Visit
Admission: EM | Admit: 2024-04-06 | Discharge: 2024-04-06 | Disposition: A | Discharge status: Home / Self Care | Payer: Self-pay | Attending: Physician Assistant | Admitting: Physician Assistant

## 2024-04-06 DIAGNOSIS — K0889 Other specified disorders of teeth and supporting structures: Secondary | ICD-10-CM

## 2024-04-06 DIAGNOSIS — L089 Local infection of the skin and subcutaneous tissue, unspecified: Secondary | ICD-10-CM | POA: Insufficient documentation

## 2024-04-06 DIAGNOSIS — L720 Epidermal cyst: Secondary | ICD-10-CM | POA: Insufficient documentation

## 2024-04-06 MED ORDER — KETOROLAC TROMETHAMINE 30 MG/ML IJ SOLN
30.0000 mg | Freq: Once | INTRAMUSCULAR | Status: AC
  Administered 2024-04-06: 30 mg via INTRAMUSCULAR

## 2024-04-06 MED ORDER — FLUCONAZOLE 150 MG PO TABS: ORAL_TABLET | ORAL | 0 refills | Status: AC

## 2024-04-06 MED ORDER — AMOXICILLIN 500 MG PO CAPS
500.0000 mg | ORAL_CAPSULE | Freq: Two times a day (BID) | ORAL | 0 refills | Status: AC
Start: 2024-04-06 — End: 2024-04-13

## 2024-04-06 NOTE — ED Triage Notes (Signed)
 I have a tooth infection somewhere in the bottom front and upper right side of my mouth, I think I need an antibiotic, the right side of my mouth tooth is broken and the front (swollen). No fever known. I originally broke the tooth some time time ago but yesterday had bitten down wrong and had lots of pain since.

## 2024-04-06 NOTE — ED Provider Notes (Signed)
 [facility]    CSN: [id number] Arrival date & time: 04/06/24  1050      History   Chief Complaint Chief Complaint  Patient presents with   Dental Problem    HPI [name] [name] is a 43 y.o. female.   Here today for evaluation of suspected dental infection.  She reports that she has pain to the bottom incisor area on her right side.  She notes that eating has worsened pain since last night.  She reports she has noticed some swelling of her gums to the area.  She has not had fever.  She has not seen a dentist recently. She took a dose of advil around 6 hours ago without resolution  The history is provided by the patient.    Past Medical History:  Diagnosis Date   Hypertension    Menorrhagia 06/16/2017    Patient Active Problem List   Diagnosis Date Noted   Infected sebaceous cyst 04/06/2024   Epidermoid cyst of skin 04/06/2024   Accelerated hypertension 01/06/2022   Overweight (BMI 25.0-29.9) 01/06/2022   Hypertensive disorder 11/21/2021   Anemia 09/23/2021   Hypertensive emergency 07/20/2021   Hypokalemia 07/20/2021   Tobacco use 07/20/2021   Elevated troponin 07/20/2021   Hypocalcemia 07/20/2021   Iron deficiency anemia due to chronic blood loss 07/20/2021   Hypertension    Menorrhagia 06/16/2017   Alcohol consumption of more than four drinks per day on alcohol screening 06/16/2017   Smoker 06/16/2017   Urge incontinence of urine 06/16/2017    Past Surgical History:  Procedure Laterality Date   CESAREAN SECTION      OB History   No obstetric history on file.      Home Medications    Prior to Admission medications   Medication Sig Start Date End Date Taking? Authorizing Provider  amLODipine -valsartan  (EXFORGE ) 10-320 MG tablet Take 1 tablet by mouth daily. 11/06/23  Yes Tobb, Kardie, DO  amoxicillin  (AMOXIL ) 500 MG capsule Take 1 capsule (500 mg total) by mouth 2 (two) times daily for 7 days. 04/06/24 04/13/24 Yes Billy Asberry FALCON, PA-C   fluconazole  (DIFLUCAN ) 150 MG tablet Take one tab PO today and repeat dose in 3 days if symptoms persist 04/06/24  Yes Billy Asberry FALCON, PA-C  hydrochlorothiazide  (MICROZIDE ) 12.5 MG capsule Take 1 capsule (12.5 mg total) by mouth once a week. 11/06/23   Tobb, Kardie, DO  FLUoxetine (PROZAC) 10 MG tablet Take 10 mg by mouth daily.  05/05/20  [provider]  metoprolol  succinate (TOPROL -XL) 50 MG 24 hr tablet Take 1 tablet (50 mg total) by mouth daily. Take with or immediately following a meal. 08/23/16 05/05/20  Griselda Norris, MD    Family History Family History  Problem Relation Age of Onset   Hypertension Mother    Diabetes Mother    Diabetes Father     Social History Social History   Tobacco Use   Smoking status: Some Days    Current packs/day: 0.10    Types: Cigarettes    Passive exposure: Current   Smokeless tobacco: Never  Vaping Use   Vaping status: Never Used  Substance Use Topics   Alcohol use: Yes    Comment: 3/week   Drug use: No     Allergies   Latex   Review of Systems Review of Systems  Constitutional:  Negative for chills and fever.  HENT:  Positive for dental problem.   Eyes:  Negative for discharge and redness.  Respiratory:  Negative for  shortness of breath.   Gastrointestinal:  Negative for abdominal pain, nausea and vomiting.     Physical Exam Triage Vital Signs ED Triage Vitals  Encounter Vitals Group     BP 04/06/24 1123 135/83     Girls Systolic BP Percentile --      Girls Diastolic BP Percentile --      Boys Systolic BP Percentile --      Boys Diastolic BP Percentile --      Pulse Rate 04/06/24 1123 76     Resp 04/06/24 1123 18     Temp 04/06/24 1123 98.3 F (36.8 C)     Temp Source 04/06/24 1123 Oral     SpO2 04/06/24 1123 100 %     Weight 04/06/24 1120 165 lb 12.6 oz (75.2 kg)     Height 04/06/24 1120 5' 3.5 (1.613 m)     Head Circumference --      Peak Flow --      Pain Score 04/06/24 1117 10     Pain Loc --       Pain Education --      Exclude from Growth Chart --    No data found.  Updated Vital Signs BP 135/83 (BP Location: Left Arm)   Pulse 76   Temp 98.3 F (36.8 C) (Oral)   Resp 18   Ht 5' 3.5 (1.613 m)   Wt 165 lb 12.6 oz (75.2 kg)   LMP 04/04/2024 (Exact Date)   SpO2 100%   BMI 28.91 kg/m   Visual Acuity Right Eye Distance:   Left Eye Distance:   Bilateral Distance:    Right Eye Near:   Left Eye Near:    Bilateral Near:     Physical Exam Vitals and nursing note reviewed.  Constitutional:      General: She is not in acute distress.    Appearance: Normal appearance. She is not ill-appearing.  HENT:     Head: Normocephalic and atraumatic.     Mouth/Throat:     Comments: Mild gingival swelling appreciated to right lower canine area. Eyes:     Conjunctiva/sclera: Conjunctivae normal.  Cardiovascular:     Rate and Rhythm: Normal rate.  Pulmonary:     Effort: Pulmonary effort is normal.  Neurological:     Mental Status: She is alert.  Psychiatric:        Mood and Affect: Mood normal.        Behavior: Behavior normal.        Thought Content: Thought content normal.      UC Treatments / Results  Labs (all labs ordered are listed, but only abnormal results are displayed) Labs Reviewed - No data to display  EKG   Radiology No results found.  Procedures Procedures (including critical care time)  Medications Ordered in UC Medications  ketorolac  (TORADOL ) 30 MG/ML injection 30 mg (30 mg Intramuscular Given 04/06/24 1247)    Initial Impression / Assessment and Plan / UC Course  I have reviewed the triage vital signs and the nursing notes.  Pertinent labs & imaging results that were available during my care of the patient were reviewed by me and considered in my medical decision making (see chart for details).    Will treat to cover possible abscess and advised follow-up with dentistry soon as possible.  Toradol  injection administered in office for pain.   Advised sooner follow up with any concerns.   Final Clinical Impressions(s) / UC Diagnoses   Final diagnoses:  Pain, dental   Discharge Instructions   None    ED Prescriptions     Medication Sig Dispense Auth. Provider   amoxicillin  (AMOXIL ) 500 MG capsule Take 1 capsule (500 mg total) by mouth 2 (two) times daily for 7 days. 14 capsule Billy Stabs F, PA-C   fluconazole  (DIFLUCAN ) 150 MG tablet Take one tab PO today and repeat dose in 3 days if symptoms persist 2 tablet Billy Stabs FALCON, PA-C      PDMP not reviewed this encounter.   Billy Stabs FALCON, PA-C 04/06/24 1254

## 2024-04-07 ENCOUNTER — Other Ambulatory Visit: Payer: Self-pay | Admitting: Cardiology
